# Patient Record
Sex: Female | Born: 1970
Health system: Southern US, Community
[De-identification: ages and names within clinical notes are randomized; demographics above are authoritative.]

## PROBLEM LIST (undated history)

## (undated) DIAGNOSIS — Z8619 Personal history of other infectious and parasitic diseases: Secondary | ICD-10-CM

## (undated) DIAGNOSIS — E669 Obesity, unspecified: Secondary | ICD-10-CM

## (undated) DIAGNOSIS — Z9884 Bariatric surgery status: Secondary | ICD-10-CM

## (undated) DIAGNOSIS — G4733 Obstructive sleep apnea (adult) (pediatric): Secondary | ICD-10-CM

## (undated) DIAGNOSIS — D649 Anemia, unspecified: Secondary | ICD-10-CM

## (undated) DIAGNOSIS — L309 Dermatitis, unspecified: Secondary | ICD-10-CM

## (undated) DIAGNOSIS — F418 Other specified anxiety disorders: Secondary | ICD-10-CM

## (undated) DIAGNOSIS — E039 Hypothyroidism, unspecified: Secondary | ICD-10-CM

## (undated) DIAGNOSIS — Z9989 Dependence on other enabling machines and devices: Secondary | ICD-10-CM

## (undated) DIAGNOSIS — I10 Essential (primary) hypertension: Secondary | ICD-10-CM

## (undated) HISTORY — DX: Other specified anxiety disorders: F41.8

## (undated) HISTORY — DX: Anemia, unspecified: D64.9

## (undated) HISTORY — DX: Obstructive sleep apnea (adult) (pediatric): G47.33

## (undated) HISTORY — DX: Essential (primary) hypertension: I10

## (undated) HISTORY — DX: Hypothyroidism, unspecified: E03.9

## (undated) HISTORY — DX: Obesity, unspecified: E66.9

## (undated) HISTORY — DX: Personal history of other infectious and parasitic diseases: Z86.19

## (undated) HISTORY — DX: Dermatitis, unspecified: L30.9

## (undated) HISTORY — DX: Dependence on other enabling machines and devices: Z99.89

## (undated) HISTORY — DX: Bariatric surgery status: Z98.84

---

## 1997-08-07 HISTORY — PX: OTHER SURGICAL HISTORY: SHX169

## 2001-08-07 HISTORY — PX: TUBAL LIGATION: SHX77

## 2004-05-17 ENCOUNTER — Ambulatory Visit: Payer: Self-pay | Admitting: Obstetrics & Gynecology

## 2007-10-15 ENCOUNTER — Ambulatory Visit: Payer: Self-pay | Admitting: Obstetrics & Gynecology

## 2010-06-13 ENCOUNTER — Ambulatory Visit (HOSPITAL_COMMUNITY): Admission: RE | Admit: 2010-06-13 | Discharge: 2010-06-13 | Payer: Self-pay | Admitting: Internal Medicine

## 2011-03-21 ENCOUNTER — Encounter: Payer: Self-pay | Admitting: Family Medicine

## 2011-03-21 ENCOUNTER — Ambulatory Visit (INDEPENDENT_AMBULATORY_CARE_PROVIDER_SITE_OTHER): Payer: BC Managed Care – PPO | Admitting: Family Medicine

## 2011-03-21 DIAGNOSIS — F341 Dysthymic disorder: Secondary | ICD-10-CM

## 2011-03-21 DIAGNOSIS — E039 Hypothyroidism, unspecified: Secondary | ICD-10-CM

## 2011-03-21 DIAGNOSIS — Z639 Problem related to primary support group, unspecified: Secondary | ICD-10-CM | POA: Insufficient documentation

## 2011-03-21 DIAGNOSIS — F418 Other specified anxiety disorders: Secondary | ICD-10-CM

## 2011-03-21 DIAGNOSIS — R030 Elevated blood-pressure reading, without diagnosis of hypertension: Secondary | ICD-10-CM

## 2011-03-21 DIAGNOSIS — IMO0001 Reserved for inherently not codable concepts without codable children: Secondary | ICD-10-CM

## 2011-03-21 DIAGNOSIS — I10 Essential (primary) hypertension: Secondary | ICD-10-CM | POA: Insufficient documentation

## 2011-03-21 MED ORDER — VENLAFAXINE HCL 75 MG PO TABS: 75.0000 mg | ORAL_TABLET | Freq: Two times a day (BID) | ORAL | Status: DC

## 2011-03-21 NOTE — Assessment & Plan Note (Signed)
requested records from endo on latest blood work, pt states had chol and glu checked then as well.

## 2011-03-21 NOTE — Patient Instructions (Addendum)
Tdap today. Med refilled today. Keep eye on blood pressure, let us know if consistently >140/90. Return in 6 months for follow up, sooner if needed. Good to see you today, call us with questions.

## 2011-03-21 NOTE — Assessment & Plan Note (Signed)
Stable today. Advised to keep track of this at work, notify us if running hihg.
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
## 2011-03-21 NOTE — Assessment & Plan Note (Addendum)
Stable on effexor. Refilled today. Seems to be coping well currently with family stressors. To notify me if any change. Denies personal hx bipolar, denies SI/HI.

## 2011-08-08 DIAGNOSIS — Z9884 Bariatric surgery status: Secondary | ICD-10-CM

## 2011-08-08 HISTORY — DX: Bariatric surgery status: Z98.84

## 2011-09-21 ENCOUNTER — Ambulatory Visit: Payer: BC Managed Care – PPO | Admitting: Family Medicine

## 2011-09-22 ENCOUNTER — Encounter: Payer: Self-pay | Admitting: Family Medicine

## 2011-09-22 ENCOUNTER — Ambulatory Visit (INDEPENDENT_AMBULATORY_CARE_PROVIDER_SITE_OTHER): Payer: BC Managed Care – PPO | Admitting: Family Medicine

## 2011-09-22 DIAGNOSIS — F341 Dysthymic disorder: Secondary | ICD-10-CM

## 2011-09-22 DIAGNOSIS — R030 Elevated blood-pressure reading, without diagnosis of hypertension: Secondary | ICD-10-CM

## 2011-09-22 DIAGNOSIS — IMO0001 Reserved for inherently not codable concepts without codable children: Secondary | ICD-10-CM

## 2011-09-22 DIAGNOSIS — E039 Hypothyroidism, unspecified: Secondary | ICD-10-CM

## 2011-09-22 DIAGNOSIS — F418 Other specified anxiety disorders: Secondary | ICD-10-CM

## 2011-09-22 LAB — TSH: TSH: 0.28 u[IU]/mL — ABNORMAL LOW (ref 0.35–5.50)

## 2011-09-22 LAB — BASIC METABOLIC PANEL
CO2: 30 mEq/L (ref 19–32)
Glucose, Bld: 130 mg/dL — ABNORMAL HIGH (ref 70–99)
Potassium: 3.6 mEq/L (ref 3.5–5.1)
Sodium: 139 mEq/L (ref 135–145)

## 2011-09-22 MED ORDER — VENLAFAXINE HCL 75 MG PO TABS: 75.0000 mg | ORAL_TABLET | Freq: Two times a day (BID) | ORAL | Status: DC

## 2011-09-22 MED ORDER — VALSARTAN-HYDROCHLOROTHIAZIDE 160-12.5 MG PO TABS: 1.0000 | ORAL_TABLET | Freq: Every day | ORAL | Status: DC

## 2011-09-22 MED ORDER — LEVOTHYROXINE SODIUM 112 MCG PO TABS: 112.0000 ug | ORAL_TABLET | Freq: Every day | ORAL | Status: DC

## 2011-09-22 NOTE — Progress Notes (Signed)
  Subjective:    Patient ID: Diana Cox, female    DOB: 06-Sep-1970, 41 y.o.   MRN: [id number]  HPI CC: 6 mo f/u  Recently grandmother passed , more stress over this.  Endorses cause of tachycardia today.  HTN - started on diovan HCT by endo, would like to follow here.  No CP/tightness, SOB, leg swelling. Did have headaches more frequency, as well as more blurry vision.  Due to recheck with eye doctor.    Hypothyroid - stable on synthroid .  Last blood work was >6 mo ago.  Due for rpt today.  + constipation and some heat intolerance.  No skin or hair changes, cold intolerance.  Anxiety/depression - doing ok on effexor bid.  Some understandable family stress due to recent funeral.  Wt Readings from Last 3 Encounters:  09/22/11 257 lb (116.574 kg)  03/21/11 241 lb 4 oz (109.43 kg)   Tdap after cut left 4th finger at work s/p surgery 04/2011  Review of Systems Per HPI    Objective:   Physical Exam  Constitutional: She appears well-developed and well-nourished. No distress.  HENT:  Head: Normocephalic and atraumatic.  Mouth/Throat: Oropharynx is clear and moist. No oropharyngeal exudate.  Eyes: Conjunctivae and EOM are normal. Pupils are equal, round, and reactive to light. No scleral icterus.  Neck: Normal range of motion. Neck supple. Carotid bruit is not present. No thyromegaly present.  Cardiovascular: Normal rate, regular rhythm, normal heart sounds and intact distal pulses.   No murmur heard. Pulmonary/Chest: Effort normal and breath sounds normal. No respiratory distress. She has no wheezes. She has no rales.  Lymphadenopathy:    She has no cervical adenopathy.  Skin: Skin is warm and dry. No rash noted.          Assessment & Plan:

## 2011-09-22 NOTE — Patient Instructions (Signed)
Sign release form for records from Endo Dr.Clark Refilled meds. Return in 6 months or as needed. Good to see you today, call us with questions. I'm sorry for your loss.

## 2011-09-22 NOTE — Assessment & Plan Note (Signed)
Stable currently on effexor 75mg  bid. Continue.

## 2011-09-22 NOTE — Assessment & Plan Note (Addendum)
Pt has signed release again today for records from Dr. Chestine Spore. Refilled meds.  Pt would like to have [place] follow hypothyroid.

## 2011-09-22 NOTE — Assessment & Plan Note (Signed)
Tolerant and compliant with med. Continue. Started by endo recently.

## 2011-09-23 ENCOUNTER — Other Ambulatory Visit: Payer: Self-pay | Admitting: Family Medicine

## 2011-09-27 ENCOUNTER — Telehealth: Payer: Self-pay

## 2011-09-27 NOTE — Telephone Encounter (Signed)
Patient returned call regarding her lab results.  Patient was notified of results and medication changes.  She was not fasting for those labs.  Patient was satisfied.

## 2012-01-16 ENCOUNTER — Encounter: Payer: Self-pay | Admitting: Family Medicine

## 2012-01-16 ENCOUNTER — Ambulatory Visit (INDEPENDENT_AMBULATORY_CARE_PROVIDER_SITE_OTHER): Payer: BC Managed Care – PPO | Admitting: Family Medicine

## 2012-01-16 VITALS — BP 110/80 | HR 88 | Temp 98.8°F | Wt 256.5 lb

## 2012-01-16 DIAGNOSIS — E039 Hypothyroidism, unspecified: Secondary | ICD-10-CM

## 2012-01-16 DIAGNOSIS — R21 Rash and other nonspecific skin eruption: Secondary | ICD-10-CM | POA: Insufficient documentation

## 2012-01-16 LAB — T4, FREE: Free T4: 0.8 ng/dL (ref 0.60–1.60)

## 2012-01-16 MED ORDER — PREDNISONE 20 MG PO TABS: ORAL_TABLET | ORAL | Status: DC

## 2012-01-16 NOTE — Patient Instructions (Signed)
I think this is a contact dermatitis - treat with course of steroids. Use benadryl by mouth 25mg  (at night as may make you sleepy). May also take claritin or zyrtec for anti itch properties.  Continue calomine and may use oatmealbath for anti itch as well. Let us know how you're doing. TSH today to recheck thyroid function. Good to see you today, call us with questions.

## 2012-01-16 NOTE — Assessment & Plan Note (Signed)
Check TSH/free T4 today. Still no records from Dr. Chestine Spore, prior endo.

## 2012-01-16 NOTE — Assessment & Plan Note (Signed)
Unclear etiology, with lichenification 2 wks into course.  Anticipate contact dermatitis, will treat with course of oral steroids.  See pt instructions for further symptomatic relief. If not better, consider biopsy.

## 2012-01-16 NOTE — Progress Notes (Signed)
  Subjective:    Patient ID: Diana Cox, female    DOB: 1971-01-02, 41 y.o.   MRN: [id number]  HPI CC: rash  2 wk h/o rash bilateral arms.  Seems to be spreading.  Very itchy.  Has not been working outside at all.  End of May went to [facility].  Tends to get dry spots in [facility] fossa in spring.  That has not bothered her recently.  No new lotions, detergents, shampoos.  Changed from ivory to dove soap.  No new medicines.  No one else at home with similar rash.  So far has tried calomine lotion, topical benadryl, alcohol/chlorox.  No fevers/chills, nausea/vomiting, abd pain, oral lesions, joint pains, other rash.  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  Some cough first thing in am.  Started eating more fruits/vegetables.  Never received records from Dr. Chestine Spore.  09/22/2011 decreased synthroid dose 2/2 TSH 0.28.  Never returned for recheck.  Will check today.  Review of Systems Per HPI    Objective:   Physical Exam NAD, WDWN, AAF, obese Bilateral arms L>R with whitish pruritic papules diffusely laterally, some spread upper arms.  + excoriations with breaking of skin.    Assessment & Plan:

## 2012-01-18 ENCOUNTER — Other Ambulatory Visit: Payer: Self-pay | Admitting: Family Medicine

## 2012-01-18 DIAGNOSIS — E039 Hypothyroidism, unspecified: Secondary | ICD-10-CM

## 2012-01-18 MED ORDER — LEVOTHYROXINE SODIUM 100 MCG PO TABS: 100.0000 ug | ORAL_TABLET | Freq: Every day | ORAL | Status: DC

## 2012-03-11 ENCOUNTER — Other Ambulatory Visit (INDEPENDENT_AMBULATORY_CARE_PROVIDER_SITE_OTHER): Payer: BC Managed Care – PPO

## 2012-03-11 DIAGNOSIS — E039 Hypothyroidism, unspecified: Secondary | ICD-10-CM

## 2012-03-11 LAB — TSH: TSH: 0.58 u[IU]/mL (ref 0.35–5.50)

## 2012-03-12 ENCOUNTER — Encounter: Payer: Self-pay | Admitting: Family Medicine

## 2012-03-12 ENCOUNTER — Ambulatory Visit (INDEPENDENT_AMBULATORY_CARE_PROVIDER_SITE_OTHER): Payer: BC Managed Care – PPO | Admitting: Family Medicine

## 2012-03-12 VITALS — BP 142/90 | HR 80 | Temp 97.9°F | Wt 257.0 lb

## 2012-03-12 DIAGNOSIS — E039 Hypothyroidism, unspecified: Secondary | ICD-10-CM

## 2012-03-12 DIAGNOSIS — I10 Essential (primary) hypertension: Secondary | ICD-10-CM

## 2012-03-12 DIAGNOSIS — R21 Rash and other nonspecific skin eruption: Secondary | ICD-10-CM

## 2012-03-12 NOTE — Patient Instructions (Signed)
Return to see Dr. Purcell Nails for possible biopsy as steroid cream has not helped rash.  If any trouble with getting in to see derm, let us know. Thyroid function normal. Keep an eye on blood pressure (via cuff at work) for next several weeks, if staying consistently >140/90, let me know for increase in blood pressure medicine. Good to see you today, call us with questions.

## 2012-03-12 NOTE — Progress Notes (Signed)
  Subjective:    Patient ID: Diana Cox, female    DOB: 02/23/1971, 41 y.o.   MRN: [id number]  HPI CC: f/u hypothyroidism  Hypothyroidism - stays hot.  No skin or hair changes, diarrhea, constipation, cold intolerance.  Compliant with levothyroxine.  Skin rash - seen here 01/2012 with new rash, see prior note for details.  Actually scheduled appt and saw derm in [place] after seen here. Dr. Purcell Nails.  Placed on halcinonide cream, using twice daily.  States no dx given, told possible biopsy if not better.  HTN - does not check at home.  No HA, vision changes, CP/tightness, SOB, leg swelling.  Saw eye doctor recently, prescription changed.  Past Medical History  Diagnosis Date  . Anemia     during pregnancy  . History of chicken pox     as child  . Depression with anxiety   . Elevated BP   . Hypothyroid     goiter     Review of Systems Per HPI    Objective:   Physical Exam  Nursing note and vitals reviewed. Constitutional: She appears well-developed and well-nourished. No distress.  HENT:  Head: Normocephalic and atraumatic.  Mouth/Throat: Oropharynx is clear and moist. No oropharyngeal exudate.  Eyes: Conjunctivae and EOM are normal. Pupils are equal, round, and reactive to light. No scleral icterus.  Neck: Normal range of motion. Neck supple. Carotid bruit is not present. No thyromegaly present.  Cardiovascular: Normal rate, regular rhythm, normal heart sounds and intact distal pulses.   No murmur heard. Pulmonary/Chest: Effort normal and breath sounds normal. No respiratory distress. She has no wheezes. She has no rales.  Musculoskeletal: She exhibits no edema.  Lymphadenopathy:    She has no cervical adenopathy.  Skin: Skin is warm and dry. Rash noted.       Bilateral arms L>R with whitish pruritic papules diffusely, start as clusters of small papules and enlarge, some spread L upper arm.  + excoriation  Psychiatric: She has a normal mood and affect.         Assessment & Plan:

## 2012-03-12 NOTE — Assessment & Plan Note (Signed)
Reviewed recent TSH with pt. Chronic. Stable.  Continue med.

## 2012-03-12 NOTE — Assessment & Plan Note (Signed)
Prior eval though consistent with contact dermatitis, however staying present for last 3 months, not improving with topical steroids prescribed by derm.  Recommended return to dermatologist for biopsy as prior discussed with her.

## 2012-03-12 NOTE — Assessment & Plan Note (Signed)
Chronic. Stable on current regimen, continue meds. Discussed possibly increasing dose if bp staying elevated outside of office, asked her to call us in a few weeks with BP update.

## 2012-03-14 ENCOUNTER — Ambulatory Visit: Payer: BC Managed Care – PPO | Admitting: Family Medicine

## 2012-03-18 ENCOUNTER — Telehealth: Payer: Self-pay

## 2012-03-18 DIAGNOSIS — R21 Rash and other nonspecific skin eruption: Secondary | ICD-10-CM

## 2012-03-18 NOTE — Telephone Encounter (Signed)
Placed referral in chart and routed to [facility]. 

## 2012-03-18 NOTE — Telephone Encounter (Signed)
Pt left v/m [facility] could not see pt until end of Nov or first of Dec. Pt request another referral to a different dermatologist; would like to be seen prior to end of Nov.Please advise.

## 2012-03-20 ENCOUNTER — Other Ambulatory Visit: Payer: BC Managed Care – PPO

## 2012-03-20 NOTE — Telephone Encounter (Signed)
Appt made with Dr Terri Piedra patient aware.

## 2012-03-20 NOTE — Telephone Encounter (Signed)
Pt called to inquire about dermatology referral. Transferred and Shirlee Limerick spoke with pt.

## 2012-03-21 ENCOUNTER — Ambulatory Visit: Payer: BC Managed Care – PPO | Admitting: Family Medicine

## 2012-04-28 ENCOUNTER — Encounter: Payer: Self-pay | Admitting: Family Medicine

## 2012-05-23 ENCOUNTER — Other Ambulatory Visit: Payer: Self-pay | Admitting: *Deleted

## 2012-05-23 MED ORDER — VENLAFAXINE HCL 75 MG PO TABS: 75.0000 mg | ORAL_TABLET | Freq: Two times a day (BID) | ORAL | Status: DC

## 2012-06-17 ENCOUNTER — Ambulatory Visit: Payer: Self-pay | Admitting: Bariatrics

## 2012-06-17 LAB — IRON AND TIBC
Iron Bind.Cap.(Total): 423 ug/dL (ref 250–450)
Iron: 109 ug/dL (ref 50–170)
Unbound Iron-Bind.Cap.: 314 ug/dL

## 2012-06-17 LAB — CBC WITH DIFFERENTIAL/PLATELET
Basophil #: 0 10*3/uL (ref 0.0–0.1)
Basophil %: 0.6 %
Eosinophil %: 3.5 %
HCT: 39.3 % (ref 35.0–47.0)
HGB: 13 g/dL (ref 12.0–16.0)
Lymphocyte #: 1.5 10*3/uL (ref 1.0–3.6)
Lymphocyte %: 26.2 %
MCHC: 33 g/dL (ref 32.0–36.0)
MCV: 92 fL (ref 80–100)
Monocyte #: 0.4 x10 3/mm (ref 0.2–0.9)
Monocyte %: 7 %
Neutrophil #: 3.6 10*3/uL (ref 1.4–6.5)
RBC: 4.29 10*6/uL (ref 3.80–5.20)
RDW: 15 % — ABNORMAL HIGH (ref 11.5–14.5)
WBC: 5.7 10*3/uL (ref 3.6–11.0)

## 2012-06-17 LAB — COMPREHENSIVE METABOLIC PANEL
BUN: 23 mg/dL — ABNORMAL HIGH (ref 7–18)
Bilirubin,Total: 0.4 mg/dL (ref 0.2–1.0)
Calcium, Total: 8.5 mg/dL (ref 8.5–10.1)
Chloride: 106 mmol/L (ref 98–107)
Co2: 27 mmol/L (ref 21–32)
EGFR (Non-African Amer.): >60
Potassium: 3.6 mmol/L (ref 3.5–5.1)
SGOT(AST): 22 U/L (ref 15–37)
SGPT (ALT): 24 U/L (ref 12–78)

## 2012-06-17 LAB — MAGNESIUM: Magnesium: 2 mg/dL

## 2012-06-17 LAB — HEMOGLOBIN A1C: Hemoglobin A1C: 6.7 % — ABNORMAL HIGH (ref 4.2–6.3)

## 2012-06-17 LAB — PHOSPHORUS: Phosphorus: 1.9 mg/dL — ABNORMAL LOW (ref 2.5–4.9)

## 2012-06-17 LAB — FOLATE: Folic Acid: 6.4 ng/mL (ref 3.1–100.0)

## 2012-06-17 LAB — LIPASE, BLOOD: Lipase: 127 U/L (ref 73–393)

## 2012-06-17 LAB — BILIRUBIN, DIRECT: Bilirubin, Direct: <0.1 mg/dL (ref 0.00–0.20)

## 2012-06-17 LAB — FERRITIN: Ferritin (ARMC): 11 ng/mL (ref 8–388)

## 2012-06-17 LAB — PROTIME-INR: INR: 0.9

## 2012-06-25 ENCOUNTER — Ambulatory Visit: Payer: Self-pay | Admitting: Bariatrics

## 2012-06-26 ENCOUNTER — Other Ambulatory Visit: Payer: Self-pay | Admitting: *Deleted

## 2012-06-26 MED ORDER — VALSARTAN-HYDROCHLOROTHIAZIDE 160-12.5 MG PO TABS: 1.0000 | ORAL_TABLET | Freq: Every day | ORAL | Status: DC

## 2012-07-07 ENCOUNTER — Ambulatory Visit: Payer: Self-pay | Admitting: Bariatrics

## 2012-07-07 HISTORY — PX: LAPAROSCOPIC GASTRIC SLEEVE RESECTION: SHX5895

## 2012-07-25 ENCOUNTER — Ambulatory Visit: Payer: Self-pay | Admitting: Bariatrics

## 2012-07-25 LAB — PREGNANCY, URINE: Pregnancy Test, Urine: NEGATIVE m[IU]/mL

## 2012-07-26 ENCOUNTER — Inpatient Hospital Stay: Payer: Self-pay | Admitting: Bariatrics

## 2012-07-27 LAB — CBC WITH DIFFERENTIAL/PLATELET
Basophil #: 0 10*3/uL (ref 0.0–0.1)
Eosinophil #: 0 10*3/uL (ref 0.0–0.7)
HCT: 36.4 % (ref 35.0–47.0)
HGB: 12.1 g/dL (ref 12.0–16.0)
Lymphocyte %: 18.9 %
MCH: 30.2 pg (ref 26.0–34.0)
MCHC: 33.1 g/dL (ref 32.0–36.0)
MCV: 91 fL (ref 80–100)
Monocyte #: 0.5 x10 3/mm (ref 0.2–0.9)
Monocyte %: 8.1 %
Neutrophil #: 4.7 10*3/uL (ref 1.4–6.5)
Neutrophil %: 72.3 %
Platelet: 216 10*3/uL (ref 150–440)
RDW: 14.4 % (ref 11.5–14.5)

## 2012-07-29 ENCOUNTER — Encounter: Payer: Self-pay | Admitting: Family Medicine

## 2012-08-20 ENCOUNTER — Ambulatory Visit: Payer: Self-pay | Admitting: Bariatrics

## 2012-08-27 ENCOUNTER — Encounter: Payer: Self-pay | Admitting: Family Medicine

## 2012-08-27 ENCOUNTER — Ambulatory Visit (INDEPENDENT_AMBULATORY_CARE_PROVIDER_SITE_OTHER): Payer: BC Managed Care – PPO | Admitting: Family Medicine

## 2012-08-27 VITALS — BP 118/80 | HR 74 | Temp 98.5°F | Ht 66.0 in | Wt 241.0 lb

## 2012-08-27 DIAGNOSIS — E66811 Obesity, class 1: Secondary | ICD-10-CM | POA: Insufficient documentation

## 2012-08-27 DIAGNOSIS — R7309 Other abnormal glucose: Secondary | ICD-10-CM

## 2012-08-27 DIAGNOSIS — R739 Hyperglycemia, unspecified: Secondary | ICD-10-CM | POA: Insufficient documentation

## 2012-08-27 DIAGNOSIS — E669 Obesity, unspecified: Secondary | ICD-10-CM | POA: Insufficient documentation

## 2012-08-27 DIAGNOSIS — E039 Hypothyroidism, unspecified: Secondary | ICD-10-CM

## 2012-08-27 DIAGNOSIS — I10 Essential (primary) hypertension: Secondary | ICD-10-CM

## 2012-08-27 MED ORDER — VALSARTAN 160 MG PO TABS: 160.0000 mg | ORAL_TABLET | Freq: Every day | ORAL | Status: DC

## 2012-08-27 NOTE — Assessment & Plan Note (Signed)
Recheck sugar when returns fasting.

## 2012-08-27 NOTE — Assessment & Plan Note (Addendum)
Great control on current regimen. Will titrate off combo med and only prescribe diovan 160mg  daily. Keep track of bp at home and update me if staying elevated. rtc 6 mo for f/u. Pt agrees with plan.

## 2012-08-27 NOTE — Progress Notes (Signed)
  Subjective:    Patient ID: Diana Cox, female    DOB: February 19, 1971, 42 y.o.   MRN: [id number]  HPI CC: check blood pressure after bariatric surgery.  07/26/2012 had sleeve gastrectomy at [facility] by Dr. Alva Garnet.  BP at work running 110s/70s.  No HA, vision changes, CP/tightness, SOB, leg swelling.  Has not been on prior bp meds other than diovan HCT.  No med ever caused cough.  Working on diet, fish for protein, juicing for vegetables. Working on exercise - walking 30 min daily.  Noted 30lb weight loss in 1 month since surgery.  Birth control - BTL.  Wt Readings from Last 3 Encounters:  08/27/12 241 lb (109.317 kg)  03/12/12 257 lb (116.574 kg)  01/16/12 256 lb 8 oz (116.348 kg)   Past Medical History  Diagnosis Date  . Anemia     during pregnancy  . History of chicken pox     as child  . Depression with anxiety   . HTN (hypertension)   . Hypothyroid     goiter  . Dermatitis     ?lichen planus s/p biopsy by derm; cx neg  . Bariatric surgery status 2013   Past Surgical History  Procedure Date  . Fingertip amputation 1999    R index  . Laparoscopic gastric sleeve resection 07/2012    Dr. Alva Garnet   Review of Systems Per HPI    Objective:   Physical Exam  Nursing note and vitals reviewed. Constitutional: She appears well-developed and well-nourished. No distress.  HENT:  Mouth/Throat: Oropharynx is clear and moist. No oropharyngeal exudate.  Eyes: Conjunctivae normal and EOM are normal. Pupils are equal, round, and reactive to light.  Neck: Normal range of motion. Neck supple. No thyromegaly present.  Cardiovascular: Normal rate, regular rhythm, normal heart sounds and intact distal pulses.   No murmur heard. Pulmonary/Chest: Effort normal and breath sounds normal. No respiratory distress. She has no wheezes. She has no rales.  Musculoskeletal: She exhibits no edema.  Lymphadenopathy:    She has no cervical adenopathy.  Skin: Skin is warm and dry. No rash noted.    Psychiatric: She has a normal mood and affect.       Assessment & Plan:

## 2012-08-27 NOTE — Assessment & Plan Note (Signed)
Chronic. May need decreased dose of levothyroxine with weight loss - check TSH today.

## 2012-08-27 NOTE — Assessment & Plan Note (Signed)
Encouraged continued weight loss. Following diet and activity plan s/p lap sleeve gastrectomy

## 2012-08-27 NOTE — Patient Instructions (Signed)
Let's back off blood pressure medicine - changed from combo pill to only diovan 160mg  daily. Keep an eye on bp at home and if consistently staying >140/90, let me know. Return in 6 months for follow up blood pressure. Blood work today - if thyroid off, we will change dose and have you come back in 1-2 months to recheck thyroid dose (and fasting chol and sugar at that time).

## 2012-08-28 LAB — TSH: TSH: 0.19 u[IU]/mL — ABNORMAL LOW (ref 0.35–5.50)

## 2012-08-29 ENCOUNTER — Other Ambulatory Visit: Payer: Self-pay | Admitting: Family Medicine

## 2012-08-29 DIAGNOSIS — E039 Hypothyroidism, unspecified: Secondary | ICD-10-CM

## 2012-08-29 MED ORDER — LEVOTHYROXINE SODIUM 88 MCG PO TABS: 88.0000 ug | ORAL_TABLET | Freq: Every day | ORAL | Status: DC

## 2012-08-30 ENCOUNTER — Encounter: Payer: Self-pay | Admitting: Family Medicine

## 2012-09-02 ENCOUNTER — Other Ambulatory Visit: Payer: BC Managed Care – PPO

## 2012-09-07 ENCOUNTER — Ambulatory Visit: Payer: Self-pay | Admitting: Bariatrics

## 2012-10-03 ENCOUNTER — Other Ambulatory Visit: Payer: BC Managed Care – PPO

## 2012-10-24 ENCOUNTER — Other Ambulatory Visit: Payer: Self-pay | Admitting: *Deleted

## 2012-10-24 MED ORDER — VENLAFAXINE HCL 75 MG PO TABS: 75.0000 mg | ORAL_TABLET | Freq: Two times a day (BID) | ORAL | Status: DC

## 2012-11-28 ENCOUNTER — Other Ambulatory Visit: Payer: Self-pay | Admitting: *Deleted

## 2012-11-28 MED ORDER — VENLAFAXINE HCL 75 MG PO TABS: 75.0000 mg | ORAL_TABLET | Freq: Two times a day (BID) | ORAL | Status: DC

## 2013-02-28 ENCOUNTER — Telehealth: Payer: Self-pay

## 2013-02-28 NOTE — Telephone Encounter (Signed)
Will see then. 

## 2013-02-28 NOTE — Telephone Encounter (Signed)
Pt request blood work for bariatric doctor, Dr Effie Shy [facility] [place]. Pt has already had sleeve surgery 07/26/2012.Dr Alva Garnet gave pt a print out of blood work pt needed to have done but Dr Alva Garnet wants pt to be seen by Dr Reece Agar also. Pt scheduled appt 03/03/13 at 8:15 am.

## 2013-03-03 ENCOUNTER — Ambulatory Visit (INDEPENDENT_AMBULATORY_CARE_PROVIDER_SITE_OTHER): Payer: BC Managed Care – PPO | Admitting: Family Medicine

## 2013-03-03 ENCOUNTER — Encounter: Payer: Self-pay | Admitting: Family Medicine

## 2013-03-03 VITALS — BP 124/72 | HR 88 | Temp 98.3°F | Wt 207.5 lb

## 2013-03-03 DIAGNOSIS — Z9884 Bariatric surgery status: Secondary | ICD-10-CM

## 2013-03-03 DIAGNOSIS — E039 Hypothyroidism, unspecified: Secondary | ICD-10-CM

## 2013-03-03 DIAGNOSIS — I10 Essential (primary) hypertension: Secondary | ICD-10-CM

## 2013-03-03 LAB — COMPREHENSIVE METABOLIC PANEL
AST: 22 U/L (ref 0–37)
Albumin: 3.8 g/dL (ref 3.5–5.2)
Alkaline Phosphatase: 70 U/L (ref 39–117)
BUN: 14 mg/dL (ref 6–23)
Glucose, Bld: 89 mg/dL (ref 70–99)
Potassium: 4.3 mEq/L (ref 3.5–5.1)
Sodium: 138 mEq/L (ref 135–145)
Total Bilirubin: 0.4 mg/dL (ref 0.3–1.2)
Total Protein: 7.1 g/dL (ref 6.0–8.3)

## 2013-03-03 LAB — LIPID PANEL
Cholesterol: 178 mg/dL (ref 0–200)
LDL Cholesterol: 118 mg/dL — ABNORMAL HIGH (ref 0–99)
Total CHOL/HDL Ratio: 4
VLDL: 17.8 mg/dL (ref 0.0–40.0)

## 2013-03-03 LAB — CBC WITH DIFFERENTIAL/PLATELET
Basophils Relative: 0.6 % (ref 0.0–3.0)
Eosinophils Absolute: 0 10*3/uL (ref 0.0–0.7)
Eosinophils Relative: 1.4 % (ref 0.0–5.0)
HCT: 39.2 % (ref 36.0–46.0)
Lymphs Abs: 1.4 10*3/uL (ref 0.7–4.0)
MCHC: 32.8 g/dL (ref 30.0–36.0)
MCV: 89.7 fl (ref 78.0–100.0)
Monocytes Absolute: 0.2 10*3/uL (ref 0.1–1.0)
Neutrophils Relative %: 49.3 % (ref 43.0–77.0)
RBC: 4.37 Mil/uL (ref 3.87–5.11)
WBC: 3.3 10*3/uL — ABNORMAL LOW (ref 4.5–10.5)

## 2013-03-03 LAB — VITAMIN B12: Vitamin B-12: 1375 pg/mL — ABNORMAL HIGH (ref 211–911)

## 2013-03-03 LAB — PHOSPHORUS: Phosphorus: 2.5 mg/dL (ref 2.3–4.6)

## 2013-03-03 LAB — AMYLASE: Amylase: 65 U/L (ref 27–131)

## 2013-03-03 LAB — MAGNESIUM: Magnesium: 2.1 mg/dL (ref 1.5–2.5)

## 2013-03-03 LAB — FOLATE: Folate: 15 ng/mL (ref >5.9)

## 2013-03-03 NOTE — Assessment & Plan Note (Signed)
Chronic, stable. Well controlled on diovan 160mg . Pt will continue to monitor, with low threshold to decrease bp med.  No changes today. BP Readings from Last 3 Encounters:  03/03/13 124/72  08/27/12 118/80  03/12/12 142/90

## 2013-03-03 NOTE — Patient Instructions (Addendum)
Blood work today.  We will fax results to Dr. Alva Garnet. Good to see you today, call us with questions. Return in 6 months, sooner if needed. Keep monitoring blood pressure at work - and let me know if consistently too low ( top number less than 100)

## 2013-03-03 NOTE — Progress Notes (Signed)
  Subjective:    Patient ID: Diana Cox, female    DOB: 04-02-1971, 42 y.o.   MRN: [id number]  HPI CC: f/u bypass surgery  Would like labs drawn today and faxed attention Dr. Alva Garnet (fax [phone]).  07/26/2012 had sleeve gastrectomy at [facility] by Dr. Alva Garnet. Has lost 35 lbs in last 6 months, total of 60 lbs. 1 hour daily at the gym, no trouble or intolerance with diet.  Wt Readings from Last 3 Encounters:  03/03/13 207 lb 8 oz (94.121 kg)  08/27/12 241 lb (109.317 kg)  03/12/12 257 lb (116.574 kg)   HTN - bp running well controlled at home.  Last check at work was 111/72.  No HA, vision changes, CP/tightness, SOB, leg swelling.  Hypothyroid - due for recheck today.  Well woman - sees Dr. Scharlene Gloss, with normal mammogram and normal pelvic/pap  Past Medical History  Diagnosis Date  . Anemia     during pregnancy  . History of chicken pox     as child  . Depression with anxiety   . HTN (hypertension)     improved with bypass  . Hypothyroid     goiter  . Dermatitis     ?lichen planus s/p biopsy by derm; cx neg  . Bariatric surgery status 2013  . OSA on CPAP 2013  . Obesity     Past Surgical History  Procedure Laterality Date  . Fingertip amputation  1999    R index  . Laparoscopic gastric sleeve resection  07/2012    Dr. Alva Garnet  . Tubal ligation  2003    bilat   Review of Systems Per HPI    Objective:   Physical Exam  Nursing note and vitals reviewed. Constitutional: She appears well-developed and well-nourished. No distress.  HENT:  Head: Normocephalic and atraumatic.  Mouth/Throat: Oropharynx is clear and moist. No oropharyngeal exudate.  Cardiovascular: Normal rate, regular rhythm, normal heart sounds and intact distal pulses.   No murmur heard. Pulmonary/Chest: Effort normal and breath sounds normal. No respiratory distress. She has no wheezes. She has no rales.  Musculoskeletal: She exhibits no edema.  Psychiatric: She has a normal mood and affect.        Assessment & Plan:

## 2013-03-03 NOTE — Assessment & Plan Note (Signed)
Chronic. Anticipate will need decreased dose - check TSH today.

## 2013-03-03 NOTE — Assessment & Plan Note (Signed)
Congratulated on weight loss! Body mass index is 33.51 kg/(m^2). Blood work today.  Will fax results attn Dr. Alva Garnet.

## 2013-03-04 LAB — VITAMIN D 25 HYDROXY (VIT D DEFICIENCY, FRACTURES): Vit D, 25-Hydroxy: 34 ng/mL (ref 30–89)

## 2013-03-07 ENCOUNTER — Other Ambulatory Visit: Payer: Self-pay | Admitting: Family Medicine

## 2013-03-07 ENCOUNTER — Encounter: Payer: Self-pay | Admitting: *Deleted

## 2013-03-07 MED ORDER — FERROUS SULFATE 325 (65 FE) MG PO TABS: 325.0000 mg | ORAL_TABLET | Freq: Every day | ORAL | Status: DC

## 2013-03-24 ENCOUNTER — Telehealth: Payer: Self-pay | Admitting: *Deleted

## 2013-03-24 NOTE — Telephone Encounter (Signed)
Medication substitution form in your IN box for completion.

## 2013-03-25 NOTE — Telephone Encounter (Signed)
Signed and placed in my outbox.

## 2013-04-14 ENCOUNTER — Encounter: Payer: Self-pay | Admitting: Family Medicine

## 2013-04-14 ENCOUNTER — Ambulatory Visit (INDEPENDENT_AMBULATORY_CARE_PROVIDER_SITE_OTHER): Payer: BC Managed Care – PPO | Admitting: Family Medicine

## 2013-04-14 VITALS — BP 116/74 | HR 84 | Temp 98.8°F | Wt 213.8 lb

## 2013-04-14 DIAGNOSIS — S6980XA Other specified injuries of unspecified wrist, hand and finger(s), initial encounter: Secondary | ICD-10-CM

## 2013-04-14 DIAGNOSIS — S6990XA Unspecified injury of unspecified wrist, hand and finger(s), initial encounter: Secondary | ICD-10-CM

## 2013-04-14 DIAGNOSIS — S6991XA Unspecified injury of right wrist, hand and finger(s), initial encounter: Secondary | ICD-10-CM

## 2013-04-14 NOTE — Assessment & Plan Note (Signed)
No evidence of infection. Will treat with vaseline gauze and discussed dressing changes with vaseline at home to hopefully keep proximal nailbed viable while new nail grows in. Discussed red flags to seek care, low threshold to start abx.

## 2013-04-14 NOTE — Progress Notes (Signed)
  Subjective:    Patient ID: Diana Cox, female    DOB: [birth year], 42 y.o.   MRN: [id number]  HPI CC:R index finger issue.  Wednesday removed R index hangnail, entire proximal nail came off with it.  Distal nail intact.  Staying sore but no redness or pus drainage  Prior h/o traumatic amputation of R index finger at work, s/p reconstructive surgery where part of bone was removed and nail grew over.  Past Medical History  Diagnosis Date  . Anemia     during pregnancy  . History of chicken pox     as child  . Depression with anxiety   . HTN (hypertension)     improved with bypass  . Hypothyroid     goiter  . Dermatitis     ?lichen planus s/p biopsy by derm; cx neg  . Bariatric surgery status 2013  . OSA on CPAP 2013  . Obesity     Past Surgical History  Procedure Laterality Date  . Fingertip amputation  1999    R index  . Laparoscopic gastric sleeve resection  07/2012    Dr. Alva Garnet  . Tubal ligation  2003    bilat    Review of Systems Per HPI    Objective:   Physical Exam  Nursing note and vitals reviewed. Constitutional: She appears well-developed and well-nourished. No distress.  Musculoskeletal:  R index finger with proximal nail missing, distal nail intact Some deformity of distal finger tip and nail present after traumatic amputation.  Skin:  No erythema        Assessment & Plan:

## 2013-04-14 NOTE — Patient Instructions (Signed)
No signs of infection today - watch for spreading redness, worsening pain or any draining Let us know if any concerns for infection Continue biotin for nails. Try to keep nailbed wet. Good to see you today, call us with questions.

## 2013-06-12 ENCOUNTER — Other Ambulatory Visit: Payer: Self-pay

## 2013-06-23 ENCOUNTER — Other Ambulatory Visit: Payer: Self-pay | Admitting: Family Medicine

## 2013-09-22 ENCOUNTER — Other Ambulatory Visit: Payer: Self-pay | Admitting: Family Medicine

## 2013-11-21 ENCOUNTER — Other Ambulatory Visit: Payer: Self-pay | Admitting: Family Medicine

## 2013-12-05 ENCOUNTER — Ambulatory Visit: Payer: BC Managed Care – PPO | Admitting: Family Medicine

## 2013-12-10 ENCOUNTER — Telehealth: Payer: Self-pay | Admitting: Family Medicine

## 2013-12-10 ENCOUNTER — Other Ambulatory Visit: Payer: Self-pay | Admitting: Family Medicine

## 2013-12-10 ENCOUNTER — Encounter: Payer: Self-pay | Admitting: Family Medicine

## 2013-12-10 ENCOUNTER — Ambulatory Visit (INDEPENDENT_AMBULATORY_CARE_PROVIDER_SITE_OTHER): Payer: BC Managed Care – PPO | Admitting: Family Medicine

## 2013-12-10 VITALS — BP 118/76 | HR 84 | Temp 98.2°F | Wt 209.8 lb

## 2013-12-10 DIAGNOSIS — E039 Hypothyroidism, unspecified: Secondary | ICD-10-CM

## 2013-12-10 DIAGNOSIS — E611 Iron deficiency: Secondary | ICD-10-CM

## 2013-12-10 DIAGNOSIS — F418 Other specified anxiety disorders: Secondary | ICD-10-CM

## 2013-12-10 DIAGNOSIS — F341 Dysthymic disorder: Secondary | ICD-10-CM

## 2013-12-10 DIAGNOSIS — D509 Iron deficiency anemia, unspecified: Secondary | ICD-10-CM | POA: Insufficient documentation

## 2013-12-10 DIAGNOSIS — I10 Essential (primary) hypertension: Secondary | ICD-10-CM

## 2013-12-10 DIAGNOSIS — R21 Rash and other nonspecific skin eruption: Secondary | ICD-10-CM

## 2013-12-10 LAB — BASIC METABOLIC PANEL
BUN: 15 mg/dL (ref 6–23)
CALCIUM: 9.4 mg/dL (ref 8.4–10.5)
CO2: 28 mEq/L (ref 19–32)
CREATININE: 0.8 mg/dL (ref 0.4–1.2)
Chloride: 105 mEq/L (ref 96–112)
GFR: 97.48 mL/min (ref >60.00)
GLUCOSE: 80 mg/dL (ref 70–99)
Potassium: 3.9 mEq/L (ref 3.5–5.1)
Sodium: 139 mEq/L (ref 135–145)

## 2013-12-10 LAB — T4, FREE: FREE T4: 0.71 ng/dL (ref 0.60–1.60)

## 2013-12-10 LAB — TSH: TSH: 0.57 u[IU]/mL (ref 0.35–4.50)

## 2013-12-10 LAB — FERRITIN: Ferritin: 8.1 ng/mL — ABNORMAL LOW (ref 10.0–291.0)

## 2013-12-10 MED ORDER — FERROUS SULFATE 325 (65 FE) MG PO TABS: 325.0000 mg | ORAL_TABLET | Freq: Two times a day (BID) | ORAL | Status: DC

## 2013-12-10 MED ORDER — VENLAFAXINE HCL 75 MG PO TABS: ORAL_TABLET | ORAL | Status: DC

## 2013-12-10 MED ORDER — LEVOTHYROXINE SODIUM 88 MCG PO TABS: ORAL_TABLET | ORAL | Status: DC

## 2013-12-10 MED ORDER — VALSARTAN 80 MG PO TABS: 80.0000 mg | ORAL_TABLET | Freq: Every day | ORAL | Status: DC

## 2013-12-10 MED ORDER — HYDROXYZINE HCL 10 MG PO TABS: 10.0000 mg | ORAL_TABLET | ORAL | Status: DC | PRN

## 2013-12-10 NOTE — Telephone Encounter (Signed)
Relevant patient education assigned to patient using Emmi. ° °

## 2013-12-10 NOTE — Assessment & Plan Note (Signed)
Reviewed need to take immediate release effexor 75mg  bid.  Pt desires to continue IR formulation due to cost. Advised to let me know if any insurance issues with this script.

## 2013-12-10 NOTE — Assessment & Plan Note (Signed)
Recheck ferritin today. continue iron daily.

## 2013-12-10 NOTE — Assessment & Plan Note (Signed)
Refilled hydroxyzine.

## 2013-12-10 NOTE — Assessment & Plan Note (Signed)
Recheck today. 

## 2013-12-10 NOTE — Assessment & Plan Note (Signed)
Chronic, stable. Will decrease diovan to 80mg  daily.  Pt agrees.

## 2013-12-10 NOTE — Progress Notes (Signed)
BP 118/76  Pulse 84  Temp(Src) 98.2 F (36.8 C) (Oral)  Wt 209 lb 12 oz (95.142 kg)  LMP 12/08/2013   CC: med refill visit  Subjective:    Patient ID: Diana Cox, female    DOB: 12/03/1970, 43 y.o.   MRN: [id number]  HPI: Diana Cox is a 43 y.o. female presenting on 12/10/2013 for Follow-up and Medication Refill   H/o hypothyroidism - compliant with levothyroxine 88mcg once daily.  Due for recheck thyroid levels.  Denies thyroid sxs.  Lab Results  Component Value Date   TSH 0.44 03/03/2013   Mood - some depression in evenings.  Takes effexor 75mg  once daily although prescribed bid.  States insurance did not approve BID dosing and only provided her with QD dosing???  Advised this should not happen and to let me know if this happens again.  Will send in BID dosing #180 with RF3.  H/o bariatric surgery. Last visit found to have low iron stores - currently on iron 1 pill daily.  Some constipation improved with a bowl of ice cream.  Pruritic skin rash - thought lichen planus.  Triggers are pollen and heat.  Atarax helps - uses at night time. requests refill. Declines refill of steroid cream.  HTN - compliant with diovan 160mg  daily.  Always well controlled BP Readings from Last 3 Encounters:  12/10/13 118/76  04/14/13 116/74  03/03/13 124/72    Wt Readings from Last 3 Encounters:  12/10/13 209 lb 12 oz (95.142 kg)  04/14/13 213 lb 12 oz (96.956 kg)  03/03/13 207 lb 8 oz (94.121 kg)   Past Medical History  Diagnosis Date  . Anemia     during pregnancy  . History of chicken pox     as child  . Depression with anxiety   . HTN (hypertension)     improved with bypass  . Hypothyroid     goiter  . Dermatitis     ?lichen planus s/p biopsy by derm; cx neg  . Bariatric surgery status 2013  . OSA on CPAP 2013  . Obesity      Past Surgical History  Procedure Laterality Date  . Fingertip amputation  1999    R index  . Laparoscopic gastric sleeve resection  07/2012     Dr. Alva Garnetyner  . Tubal ligation  2003    bilat    Relevant past medical, surgical, family and social history reviewed and updated as indicated.  Allergies and medications reviewed and updated. Current Outpatient Prescriptions on File Prior to Visit  Medication Sig  . B Complex-C (B-COMPLEX WITH VITAMIN C) tablet Take 1 tablet by mouth daily.  . Biotin 1000 MCG tablet Take 2,000 mcg by mouth daily.  . cholecalciferol (VITAMIN D) 1000 UNITS tablet Take 1,000 Units by mouth daily.  . ferrous sulfate 325 (65 FE) MG tablet Take 1 tablet (325 mg total) by mouth daily with breakfast.  . Halcinonide (HALOG) 0.1 % CREA Apply topically 2 (two) times daily.  . Multiple Vitamin (MULTIVITAMIN) tablet Take 1 tablet by mouth daily.     No current facility-administered medications on file prior to visit.    Review of Systems Per HPI unless specifically indicated above    Objective:    BP 118/76  Pulse 84  Temp(Src) 98.2 F (36.8 C) (Oral)  Wt 209 lb 12 oz (95.142 kg)  LMP 12/08/2013  Physical Exam  Nursing note and vitals reviewed. Constitutional: She appears well-developed and well-nourished. No distress.  HENT:  Mouth/Throat: Oropharynx is clear and moist. No oropharyngeal exudate.  Cardiovascular: Normal rate, regular rhythm, normal heart sounds and intact distal pulses.   No murmur heard. Pulmonary/Chest: Effort normal and breath sounds normal. No respiratory distress. She has no wheezes. She has no rales.  Musculoskeletal: She exhibits no edema.  Skin: Rash noted.  Papular pruritic rash bilateral forearms. Scars bilateral arms from prior rash outbreak.       Assessment & Plan:  Pt states she is due for well woman exam (with [facility]).  Problem List Items Addressed This Visit   Depression with anxiety     Reviewed need to take immediate release effexor 75mg  bid.  Pt desires to continue IR formulation due to cost. Advised to let me know if any insurance issues with this  script.    HTN (hypertension)     Chronic, stable. Will decrease diovan to 80mg  daily.  Pt agrees.    Relevant Medications      valsartan (DIOVAN) tablet   Other Relevant Orders      Basic metabolic panel   Hypothyroid - Primary     Recheck today.    Relevant Medications      levothyroxine (SYNTHROID, LEVOTHROID) tablet   Other Relevant Orders      TSH      T4, Free   Skin rash     Refilled hydroxyzine.    Iron deficiency     Recheck ferritin today. continue iron daily.    Relevant Orders      Ferritin       Follow up plan: Return in about 1 year (around 12/11/2014), or as needed, for follow up.

## 2013-12-10 NOTE — Patient Instructions (Addendum)
I've refilled meds today. Blood work today and we will call you with results. Hold on refilling thyroid medicine until you get results. I've sent in twice daily dosing of effexor.  Let me know if any issues with this.

## 2013-12-10 NOTE — Progress Notes (Signed)
Pre visit review using our clinic review tool, if applicable. No additional management support is needed unless otherwise documented below in the visit note. 

## 2014-08-07 LAB — HM PAP SMEAR: HM PAP: NORMAL

## 2014-08-07 LAB — HM MAMMOGRAPHY: HM MAMMO: NORMAL (ref 0–4)

## 2014-09-22 ENCOUNTER — Ambulatory Visit: Payer: Self-pay | Admitting: Bariatrics

## 2014-10-06 ENCOUNTER — Encounter: Payer: Self-pay | Admitting: Family Medicine

## 2014-10-06 ENCOUNTER — Ambulatory Visit (INDEPENDENT_AMBULATORY_CARE_PROVIDER_SITE_OTHER): Payer: BLUE CROSS/BLUE SHIELD | Admitting: Family Medicine

## 2014-10-06 VITALS — BP 112/80 | HR 80 | Temp 99.1°F | Ht 66.0 in | Wt 206.8 lb

## 2014-10-06 DIAGNOSIS — Z23 Encounter for immunization: Secondary | ICD-10-CM

## 2014-10-06 DIAGNOSIS — Z0001 Encounter for general adult medical examination with abnormal findings: Secondary | ICD-10-CM | POA: Insufficient documentation

## 2014-10-06 DIAGNOSIS — E039 Hypothyroidism, unspecified: Secondary | ICD-10-CM

## 2014-10-06 DIAGNOSIS — E669 Obesity, unspecified: Secondary | ICD-10-CM

## 2014-10-06 DIAGNOSIS — E611 Iron deficiency: Secondary | ICD-10-CM | POA: Diagnosis not present

## 2014-10-06 DIAGNOSIS — Z9884 Bariatric surgery status: Secondary | ICD-10-CM

## 2014-10-06 DIAGNOSIS — I10 Essential (primary) hypertension: Secondary | ICD-10-CM | POA: Diagnosis not present

## 2014-10-06 DIAGNOSIS — F418 Other specified anxiety disorders: Secondary | ICD-10-CM

## 2014-10-06 DIAGNOSIS — Z0000 Encounter for general adult medical examination without abnormal findings: Secondary | ICD-10-CM

## 2014-10-06 DIAGNOSIS — R739 Hyperglycemia, unspecified: Secondary | ICD-10-CM

## 2014-10-06 LAB — VITAMIN D 25 HYDROXY (VIT D DEFICIENCY, FRACTURES): VITD: 24.16 ng/mL — ABNORMAL LOW (ref 30.00–100.00)

## 2014-10-06 LAB — COMPREHENSIVE METABOLIC PANEL
ALK PHOS: 72 U/L (ref 39–117)
ALT: 15 U/L (ref 0–35)
AST: 20 U/L (ref 0–37)
Albumin: 4.2 g/dL (ref 3.5–5.2)
BUN: 14 mg/dL (ref 6–23)
CO2: 32 meq/L (ref 19–32)
Calcium: 9.5 mg/dL (ref 8.4–10.5)
Chloride: 102 mEq/L (ref 96–112)
Creatinine, Ser: 0.7 mg/dL (ref 0.40–1.20)
GFR: 116.56 mL/min (ref >60.00)
Glucose, Bld: 97 mg/dL (ref 70–99)
Potassium: 4.3 mEq/L (ref 3.5–5.1)
SODIUM: 138 meq/L (ref 135–145)
Total Bilirubin: 0.4 mg/dL (ref 0.2–1.2)
Total Protein: 7.1 g/dL (ref 6.0–8.3)

## 2014-10-06 LAB — CBC WITH DIFFERENTIAL/PLATELET
BASOS PCT: 0.6 % (ref 0.0–3.0)
Basophils Absolute: 0 10*3/uL (ref 0.0–0.1)
Eosinophils Absolute: 0 10*3/uL (ref 0.0–0.7)
Eosinophils Relative: 1.3 % (ref 0.0–5.0)
HCT: 40.3 % (ref 36.0–46.0)
Hemoglobin: 13.3 g/dL (ref 12.0–15.0)
Lymphocytes Relative: 35.9 % (ref 12.0–46.0)
Lymphs Abs: 1.2 10*3/uL (ref 0.7–4.0)
MCHC: 33 g/dL (ref 30.0–36.0)
MCV: 89.2 fl (ref 78.0–100.0)
MONO ABS: 0.3 10*3/uL (ref 0.1–1.0)
Monocytes Relative: 7.4 % (ref 3.0–12.0)
NEUTROS ABS: 1.9 10*3/uL (ref 1.4–7.7)
NEUTROS PCT: 54.8 % (ref 43.0–77.0)
Platelets: 282 10*3/uL (ref 150.0–400.0)
RBC: 4.52 Mil/uL (ref 3.87–5.11)
RDW: 14.1 % (ref 11.5–15.5)
WBC: 3.5 10*3/uL — ABNORMAL LOW (ref 4.0–10.5)

## 2014-10-06 LAB — LIPID PANEL
CHOLESTEROL: 185 mg/dL (ref 0–200)
HDL: 54.7 mg/dL (ref >39.00)
LDL Cholesterol: 119 mg/dL — ABNORMAL HIGH (ref 0–99)
NONHDL: 130.3
Total CHOL/HDL Ratio: 3
Triglycerides: 58 mg/dL (ref 0.0–149.0)
VLDL: 11.6 mg/dL (ref 0.0–40.0)

## 2014-10-06 LAB — PHOSPHORUS: Phosphorus: 2.7 mg/dL (ref 2.3–4.6)

## 2014-10-06 LAB — IBC PANEL
IRON: 60 ug/dL (ref 42–145)
SATURATION RATIOS: 14.7 % — AB (ref 20.0–50.0)
TRANSFERRIN: 292 mg/dL (ref 212.0–360.0)

## 2014-10-06 LAB — MAGNESIUM: MAGNESIUM: 2.1 mg/dL (ref 1.5–2.5)

## 2014-10-06 LAB — VITAMIN B12: Vitamin B-12: >1500 pg/mL — ABNORMAL HIGH (ref 211–911)

## 2014-10-06 LAB — AMYLASE: Amylase: 52 U/L (ref 27–131)

## 2014-10-06 LAB — T4, FREE: FREE T4: 0.95 ng/dL (ref 0.60–1.60)

## 2014-10-06 LAB — FERRITIN: FERRITIN: 10.2 ng/mL (ref 10.0–291.0)

## 2014-10-06 LAB — TSH: TSH: 0.38 u[IU]/mL (ref 0.35–4.50)

## 2014-10-06 LAB — FOLATE

## 2014-10-06 MED ORDER — FERROUS SULFATE 325 (65 FE) MG PO TABS: 325.0000 mg | ORAL_TABLET | Freq: Every day | ORAL | Status: DC

## 2014-10-06 MED ORDER — VENLAFAXINE HCL 100 MG PO TABS: ORAL_TABLET | ORAL | Status: DC

## 2014-10-06 NOTE — Assessment & Plan Note (Signed)
Preventative protocols reviewed and updated unless pt declined. Discussed healthy diet and lifestyle.  

## 2014-10-06 NOTE — Assessment & Plan Note (Signed)
Continues iron tablet daily. Recheck today.

## 2014-10-06 NOTE — Assessment & Plan Note (Addendum)
Recheck sugar level today - fasting.

## 2014-10-06 NOTE — Patient Instructions (Addendum)
Flu shot today. Blood work today. Increase water and fiber. If not helpful, start colace (docusate) 100mg  daily. Increase effexor to 100mg  twice daily. Update me with effect in 1 month.

## 2014-10-06 NOTE — Progress Notes (Signed)
Pre visit review using our clinic review tool, if applicable. No additional management support is needed unless otherwise documented below in the visit note. 

## 2014-10-06 NOTE — Assessment & Plan Note (Signed)
Will increase effexor to 100mg  bid. Pt will call me with update in 1 month. Consider change to SR.

## 2014-10-06 NOTE — Assessment & Plan Note (Addendum)
check labs today and will fax attn Dr Alva Garnetyner Fax #[phone]

## 2014-10-06 NOTE — Assessment & Plan Note (Signed)
Chronic, stable. Check TSH today.  

## 2014-10-06 NOTE — Assessment & Plan Note (Signed)
Chronic, stable. Continue regimen. 

## 2014-10-06 NOTE — Addendum Note (Signed)
Addended by: Josph MachoANCE, KIMBERLY A on: 10/06/2014 11:10 AM   Modules accepted: Orders

## 2014-10-06 NOTE — Progress Notes (Signed)
BP 112/80 mmHg  Pulse 80  Temp(Src) 99.1 F (37.3 C) (Oral)  Ht 5\' 6"  (1.676 m)  Wt 206 lb 12 oz (93.781 kg)  BMI 33.39 kg/m2  LMP 09/22/2014   CC: CPE  Subjective:    Patient ID: Diana Cox, female    DOB: [birth year], 44 y.o.   MRN: [id number]  HPI: Diana Cox is a 44 y.o. female presenting on 10/06/2014 for Annual Exam   On effexor 75mg  bid. On this med since 2011. Over last 6 months noticing more trouble with mood lability. Not feeling overwhelmed. Adult coloring book helpful.  Sleeping well.   S/p lap gastric sleeve.  Fasting today.  Preventative: Breast cancer screening - normal mammogram 2013 Well woman exam - with [facility] Dr Jennette KettleNeal, needs to reschedule S/p BTL 2003 Flu shot - today Tetanus shot - 2012 Seat belt use discussed Sunscreen use and skin screen discussed  Caffeine: 2 cans mountain dew/day (1 hour drive, [place] Lives with BF, 1 cat Occ: works in Designer, fashion/clothingtextiles, Production managerbuilding tires Edu: AA 07/2011 Act: walks, bikes Diet: getting healthy, lots of water  Relevant past medical, surgical, family and social history reviewed and updated as indicated. Interim medical history since our last visit reviewed. Allergies and medications reviewed and updated. Current Outpatient Prescriptions on File Prior to Visit  Medication Sig  . B Complex-C (B-COMPLEX WITH VITAMIN C) tablet Take 1 tablet by mouth daily.  . Biotin 1000 MCG tablet Take 2,000 mcg by mouth daily.  . cholecalciferol (VITAMIN D) 1000 UNITS tablet Take 1,000 Units by mouth daily.  Marland Kitchen. levothyroxine (SYNTHROID, LEVOTHROID) 88 MCG tablet TAKE ONE (1) TABLET BY MOUTH EVERY DAY  . Multiple Vitamin (MULTIVITAMIN) tablet Take 1 tablet by mouth daily.    . valsartan (DIOVAN) 80 MG tablet Take 1 tablet (80 mg total) by mouth daily.   No current facility-administered medications on file prior to visit.    Review of Systems  Constitutional: Negative for fever, chills, activity change, appetite  change, fatigue and unexpected weight change.  HENT: Negative for hearing loss.   Eyes: Positive for visual disturbance (new contacts pending).  Respiratory: Negative for cough, chest tightness, shortness of breath and wheezing.   Cardiovascular: Negative for chest pain, palpitations and leg swelling.  Gastrointestinal: Positive for constipation (due to iron). Negative for nausea, vomiting, abdominal pain, diarrhea, blood in stool and abdominal distention.  Genitourinary: Negative for hematuria and difficulty urinating.  Musculoskeletal: Negative for myalgias, arthralgias and neck pain.  Skin: Negative for rash.  Neurological: Negative for dizziness, seizures, syncope and headaches.  Hematological: Negative for adenopathy. Does not bruise/bleed easily.  Psychiatric/Behavioral: Positive for dysphoric mood. The patient is nervous/anxious.    Per HPI unless specifically indicated above     Objective:    BP 112/80 mmHg  Pulse 80  Temp(Src) 99.1 F (37.3 C) (Oral)  Ht 5\' 6"  (1.676 m)  Wt 206 lb 12 oz (93.781 kg)  BMI 33.39 kg/m2  LMP 09/22/2014  Wt Readings from Last 3 Encounters:  10/06/14 206 lb 12 oz (93.781 kg)  12/10/13 209 lb 12 oz (95.142 kg)  04/14/13 213 lb 12 oz (96.956 kg)    Physical Exam  Constitutional: She is oriented to person, place, and time. She appears well-developed and well-nourished. No distress.  Obese and well appearing  HENT:  Head: Normocephalic and atraumatic.  Right Ear: Hearing, tympanic membrane, external ear and ear canal normal.  Left Ear: Hearing, tympanic membrane, external ear and ear  canal normal.  Nose: Nose normal.  Mouth/Throat: Uvula is midline, oropharynx is clear and moist and mucous membranes are normal. No oropharyngeal exudate, posterior oropharyngeal edema or posterior oropharyngeal erythema.  Eyes: Conjunctivae and EOM are normal. Pupils are equal, round, and reactive to light. No scleral icterus.  Neck: Normal range of motion. Neck  supple. No thyromegaly present.  Cardiovascular: Normal rate, regular rhythm, normal heart sounds and intact distal pulses.   No murmur heard. Pulses:      Radial pulses are 2+ on the right side, and 2+ on the left side.  Pulmonary/Chest: Effort normal and breath sounds normal. No respiratory distress. She has no wheezes. She has no rales.  Abdominal: Soft. Bowel sounds are normal. She exhibits no distension and no mass. There is no tenderness. There is no rebound and no guarding.  Musculoskeletal: Normal range of motion. She exhibits no edema.  Lymphadenopathy:    She has no cervical adenopathy.  Neurological: She is alert and oriented to person, place, and time.  CN grossly intact, station and gait intact  Skin: Skin is warm and dry. No rash noted.  Psychiatric: She has a normal mood and affect. Her behavior is normal. Judgment and thought content normal.  Nursing note and vitals reviewed.  Results for orders placed or performed in visit on 12/10/13  Ferritin  Result Value Ref Range   Ferritin 8.1 (L) 10.0 - 291.0 ng/mL  TSH  Result Value Ref Range   TSH 0.57 0.35 - 4.50 uIU/mL  T4, Free  Result Value Ref Range   Free T4 0.71 0.60 - 1.60 ng/dL  Basic metabolic panel  Result Value Ref Range   Sodium 139 135 - 145 mEq/L   Potassium 3.9 3.5 - 5.1 mEq/L   Chloride 105 96 - 112 mEq/L   CO2 28 19 - 32 mEq/L   Glucose, Bld 80 70 - 99 mg/dL   BUN 15 6 - 23 mg/dL   Creatinine, Ser 0.8 0.4 - 1.2 mg/dL   Calcium 9.4 8.4 - 16.1 mg/dL   GFR 09.60 >45.40 mL/min      Assessment & Plan:   Problem List Items Addressed This Visit    Obesity    Continued weight loss noted. Active with regular exercise routine and adheres to healthy diet.      Iron deficiency    Continues iron tablet daily. Recheck today.      Relevant Orders   IBC panel   Folate   Hypothyroid    Chronic, stable. Check TSH today.      Relevant Orders   T4, free   TSH   Hyperglycemia    Recheck sugar level  today - fasting.      HTN (hypertension)    Chronic, stable. Continue regimen.      Relevant Orders   Comprehensive metabolic panel   Lipid panel   Health care maintenance - Primary    Preventative protocols reviewed and updated unless pt declined. Discussed healthy diet and lifestyle.       Depression with anxiety    Will increase effexor to  bid. Pt will call me with update in 1 month. Consider change to SR.      Bariatric surgery status    check labs today and will fax attn Dr Alva Garnet Fax #[phone]      Relevant Orders   CBC with Differential/Platelet   Vitamin B12   Ferritin   Vit D  25 hydroxy (rtn osteoporosis monitoring)   Magnesium  Amylase   Phosphorus   Vitamin B1   Prealbumin       Follow up plan: Return in about 1 year (around 10/06/2015), or as needed, for annual exam, prior fasting for blood work.

## 2014-10-06 NOTE — Assessment & Plan Note (Signed)
Continued weight loss noted. Active with regular exercise routine and adheres to healthy diet.

## 2014-10-07 LAB — PREALBUMIN: PREALBUMIN: 19.2 mg/dL (ref 17.0–34.0)

## 2014-10-09 LAB — VITAMIN B1: Vitamin B1 (Thiamine): 8 nmol/L (ref 8–30)

## 2014-11-27 NOTE — Op Note (Signed)
PATIENT NAME:  Diana Cox, Diana Cox MR#:  [id number] DATE OF BIRTH:  10/04/1970  DATE OF PROCEDURE:  07/26/2012   PROCEDURE PERFORMED: Laparoscopic sleeve gastrectomy.   SURGEON: Tyrone AppleMichael A. Tyner, MD.   PREOPERATIVE DIAGNOSIS:  Morbid obesity with BMI of 43, associated with hypertension.   POSTOPERATIVE DIAGNOSIS:  Morbid obesity with BMI of 43, associated with hypertension.  DESCRIPTION OF PROCEDURE: The patient was brought to the Operating Room and placed in the supine position. General anesthesia was obtained with orotracheal intubation. The patient had a Foley catheter inserted sterily. TED hose and Thromboguards were applied and a footboard applied at the end of the operative bed. The patient then had prep and drape of the lower chest and abdomen. A 5 mm Optiview trocar was introduced under direct visualization into the left lateral abdomen. Pneumoperitoneum was obtained with carbon dioxide. Next, three additional trocars introduced across the upper abdomen. The patient also had a Nathanson liver retractor introduced through a subxiphoid wound. Upon elevation of the left lobe of the liver there was no overt evidence of a hiatal hernia. The patient had division of vascular pedicles along the greater curvature of the stomach beginning approximately 4 cm proximal to the pylorus. This was then extended through the entire gastrocolic and gastrosplenic ligament. The undersurface of the left hemidiaphragm was freed from the upper fundus by division of lymphovascular attachments. Ultimately, the region of the angle of His was identified. There was again, no evidence of a hiatal hernia appreciated. Posterior attachments to the pancreas were taken down by division of the ligamentous attachments or peritoneal attachments. The patient at this point, a 34-French bougie passed transorally. This was directed in the level of the antrum. The patient had a series of GIA staplers fired to create a medially-based gastric  tube effect. The first firing was a relative transverse direction in an effort to avoid narrowing at the level of the incisura. Next, a vertical line of staples was placed parallel to the lesser curvature and brought out just lateral to the angle of His. The staple line noted to be consistent from one firing to the other. The patient had approximation of portions of the divided gastrosplenic and gastrocolic ligament to the side margins of the gastric sleeve with interrupted 2-0 Ethibond suture. This was done to prevent any over rotation of the gastric tube effect. Next, the lateral stomach was delivered through the trocar defect in the right upper quadrant after digital dilation performed. The trocar was a 12 mm defect. After removal of the stomach, the fascia and peritoneum of this wound was closed with 0-Vicryl suture, as passed by a needle suture passing system. The wounds were injected with 0.25% Marcaine followed by 4-0 Monocryl in the dermis, followed by Dermabond. The patient was allowed to recover at this point having tolerated the procedure well. There was minimal blood loss.    ____________________________ Tyrone AppleMichael A. Alva Garnetyner, MD mat:eg D: 07/26/2012 18:29:42 ET T: 07/28/2012 17:10:25 ET JOB#: [id number]  cc:  1.  Casimiro NeedleMichael A. Alva Garnetyner, MD, <Dictator> 2.  Eustaquio BoydenJavier Gutierrez, MD Everette RankMICHAEL A TYNER MD ELECTRONICALLY SIGNED 08/27/2012 15:41

## 2015-01-06 ENCOUNTER — Other Ambulatory Visit: Payer: Self-pay | Admitting: Family Medicine

## 2015-01-26 ENCOUNTER — Other Ambulatory Visit: Payer: Self-pay | Admitting: Family Medicine

## 2015-07-29 ENCOUNTER — Telehealth: Payer: Self-pay | Admitting: *Deleted

## 2015-07-29 NOTE — Telephone Encounter (Signed)
Med interaction form in your IN box for review.

## 2015-08-02 NOTE — Telephone Encounter (Signed)
Noted NSAID decreases effectiveness of ARB.

## 2015-10-18 ENCOUNTER — Other Ambulatory Visit: Payer: Self-pay | Admitting: Family Medicine

## 2015-11-04 ENCOUNTER — Ambulatory Visit (INDEPENDENT_AMBULATORY_CARE_PROVIDER_SITE_OTHER): Payer: BLUE CROSS/BLUE SHIELD | Admitting: Family Medicine

## 2015-11-04 ENCOUNTER — Encounter: Payer: Self-pay | Admitting: Family Medicine

## 2015-11-04 VITALS — BP 110/76 | HR 94 | Temp 97.3°F | Wt 200.5 lb

## 2015-11-04 DIAGNOSIS — E611 Iron deficiency: Secondary | ICD-10-CM

## 2015-11-04 DIAGNOSIS — I10 Essential (primary) hypertension: Secondary | ICD-10-CM

## 2015-11-04 DIAGNOSIS — E039 Hypothyroidism, unspecified: Secondary | ICD-10-CM | POA: Diagnosis not present

## 2015-11-04 DIAGNOSIS — E669 Obesity, unspecified: Secondary | ICD-10-CM | POA: Diagnosis not present

## 2015-11-04 DIAGNOSIS — F418 Other specified anxiety disorders: Secondary | ICD-10-CM

## 2015-11-04 MED ORDER — VALSARTAN 40 MG PO TABS: 40.0000 mg | ORAL_TABLET | Freq: Every day | ORAL | Status: DC

## 2015-11-04 NOTE — Patient Instructions (Addendum)
Try lower diovan dose - 40mg  daily sent to pharmacy. Continue thyroid medicine. meds refilled today. labwork today. You are doing great! Return as needed or in 1 year for a physical.

## 2015-11-04 NOTE — Assessment & Plan Note (Signed)
Feels stable on current regimen, desires to continue. Refilled.

## 2015-11-04 NOTE — Assessment & Plan Note (Signed)
Check TSH today. Refilled today.

## 2015-11-04 NOTE — Assessment & Plan Note (Signed)
Now just taking MVI with iron.

## 2015-11-04 NOTE — Progress Notes (Signed)
Pre visit review using our clinic review tool, if applicable. No additional management support is needed unless otherwise documented below in the visit note. 

## 2015-11-04 NOTE — Progress Notes (Signed)
BP 110/76 mmHg  Pulse 94  Temp(Src) 97.3 F (36.3 C) (Oral)  Wt 200 lb 8 oz (90.946 kg)  SpO2 98%   CC: f/u visit  Subjective:    Patient ID: Diana Cox, female    DOB: 1971/03/16, 45 y.o.   MRN: [id number]  HPI: Diana Cox is a 45 y.o. female presenting on 11/04/2015 for Follow-up   Weight loss noted - s/p sleeve gastrectomy. Continued weight loss noted.  Depression with anxiety - last visit we increased effexor to  twice daily. On this med since 2011.   HTN - Compliant with current antihypertensive regimen of diovan  daily.  Does not check blood pressures at home.  No low blood pressure readings or symptoms of dizziness/syncope.  Denies HA, vision changes, CP/tightness, SOB, leg swelling.    IDA - on iron tablet daily  Hypothyroid - compliant with thyroid medication.   Relevant past medical, surgical, family and social history reviewed and updated as indicated. Interim medical history since our last visit reviewed. Allergies and medications reviewed and updated. Current Outpatient Prescriptions on File Prior to Visit  Medication Sig  . levothyroxine (SYNTHROID, LEVOTHROID) 88 MCG tablet TAKE 1 TABLET BY MOUTH DAILY  . venlafaxine (EFFEXOR) 100 MG tablet Take 1 tablet (100 mg total) by mouth 2 (two) times daily. **MUST HAVE PHYSICAL FOR FURTHER REFILLS**   No current facility-administered medications on file prior to visit.    Review of Systems Per HPI unless specifically indicated in ROS section     Objective:    BP 110/76 mmHg  Pulse 94  Temp(Src) 97.3 F (36.3 C) (Oral)  Wt 200 lb 8 oz (90.946 kg)  SpO2 98%  Wt Readings from Last 3 Encounters:  11/04/15 200 lb 8 oz (90.946 kg)  10/06/14 206 lb 12 oz (93.781 kg)  12/10/13 209 lb 12 oz (95.142 kg)   Body mass index is 32.38 kg/(m^2).  Physical Exam  Constitutional: She appears well-developed and well-nourished. No distress.  HENT:  Mouth/Throat: Oropharynx is clear and moist. No  oropharyngeal exudate.  Neck: Normal range of motion. Neck supple. No thyromegaly present.  Cardiovascular: Normal rate, regular rhythm, normal heart sounds and intact distal pulses.   No murmur heard. Pulmonary/Chest: Effort normal and breath sounds normal. No respiratory distress. She has no wheezes. She has no rales.  Musculoskeletal: She exhibits no edema.  Psychiatric: She has a normal mood and affect. Her behavior is normal. Judgment and thought content normal.  Nursing note and vitals reviewed.  Results for orders placed or performed in visit on 10/06/14  Comprehensive metabolic panel  Result Value Ref Range   Sodium 138 135 - 145 mEq/L   Potassium 4.3 3.5 - 5.1 mEq/L   Chloride 102 96 - 112 mEq/L   CO2 32 19 - 32 mEq/L   Glucose, Bld 97 70 - 99 mg/dL   BUN 14 6 - 23 mg/dL   Creatinine, Ser 4.09 0.40 - 1.20 mg/dL   Total Bilirubin 0.4 0.2 - 1.2 mg/dL   Alkaline Phosphatase 72 39 - 117 U/L   AST 20 0 - 37 U/L   ALT 15 0 - 35 U/L   Total Protein 7.1 6.0 - 8.3 g/dL   Albumin 4.2 3.5 - 5.2 g/dL   Calcium 9.5 8.4 - 81.1 mg/dL   GFR 914.78 >29.56 mL/min  CBC with Differential/Platelet  Result Value Ref Range   WBC 3.5 (L) 4.0 - 10.5 K/uL   RBC 4.52 3.87 - 5.11  Mil/uL   Hemoglobin 13.3 12.0 - 15.0 g/dL   HCT 16.140.3 09.636.0 - 04.546.0 %   MCV 89.2 78.0 - 100.0 fl   MCHC 33.0 30.0 - 36.0 g/dL   RDW 40.914.1 81.111.5 - 91.415.5 %   Platelets 282.0 150.0 - 400.0 K/uL   Neutrophils Relative % 54.8 43.0 - 77.0 %   Lymphocytes Relative 35.9 12.0 - 46.0 %   Monocytes Relative 7.4 3.0 - 12.0 %   Eosinophils Relative 1.3 0.0 - 5.0 %   Basophils Relative 0.6 0.0 - 3.0 %   Neutro Abs 1.9 1.4 - 7.7 K/uL   Lymphs Abs 1.2 0.7 - 4.0 K/uL   Monocytes Absolute 0.3 0.1 - 1.0 K/uL   Eosinophils Absolute 0.0 0.0 - 0.7 K/uL   Basophils Absolute 0.0 0.0 - 0.1 K/uL  T4, free  Result Value Ref Range   Free T4 0.95 0.60 - 1.60 ng/dL  TSH  Result Value Ref Range   TSH 0.38 0.35 - 4.50 uIU/mL  Vitamin B12    Result Value Ref Range   Vitamin B-12 >1500 (H) 211 - 911 pg/mL  Ferritin  Result Value Ref Range   Ferritin 10.2 10.0 - 291.0 ng/mL  IBC panel  Result Value Ref Range   Iron 60 42 - 145 ug/dL   Transferrin 782.9292.0 562.1212.0 - 360.0 mg/dL   Saturation Ratios 30.814.7 (L) 20.0 - 50.0 %  Folate  Result Value Ref Range   Folate >24.6 >5.9 ng/mL  Vit D  25 hydroxy (rtn osteoporosis monitoring)  Result Value Ref Range   VITD 24.16 (L) 30.00 - 100.00 ng/mL  Magnesium  Result Value Ref Range   Magnesium 2.1 1.5 - 2.5 mg/dL  Amylase  Result Value Ref Range   Amylase 52 27 - 131 U/L  Phosphorus  Result Value Ref Range   Phosphorus 2.7 2.3 - 4.6 mg/dL  Vitamin B1  Result Value Ref Range   Vitamin B1 (Thiamine) 8 8 - 30 nmol/L  Prealbumin  Result Value Ref Range   Prealbumin 19.2 17.0 - 34.0 mg/dL  Lipid panel  Result Value Ref Range   Cholesterol 185 0 - 200 mg/dL   Triglycerides 65.758.0 0.0 - 149.0 mg/dL   HDL 84.6954.70 >62.95>39.00 mg/dL   VLDL 28.411.6 0.0 - 13.240.0 mg/dL   LDL Cholesterol 440119 (H) 0 - 99 mg/dL   Total CHOL/HDL Ratio 3    NonHDL 130.30       Assessment & Plan:   Problem List Items Addressed This Visit    Depression with anxiety    Feels stable on current regimen, desires to continue. Refilled.      HTN (hypertension) - Primary    Chronic, stable. Continue current regimen.      Relevant Medications   valsartan (DIOVAN) 40 MG tablet   Other Relevant Orders   Basic metabolic panel   Hypothyroid    Check TSH today. Refilled today.      Relevant Orders   TSH   Obesity, Class I, BMI 30-34.9    Continued weight loss, bariatric surgery labs monitored by Dr Alva Garnetyner bariatric surgeon.      Iron deficiency    Now just taking MVI with iron.          Follow up plan: Return in about 1 year (around 11/03/2016), or as needed, for annual exam, prior fasting for blood work.

## 2015-11-04 NOTE — Assessment & Plan Note (Addendum)
Continued weight loss, bariatric surgery labs monitored by Dr Alva Garnetyner bariatric surgeon.

## 2015-11-04 NOTE — Assessment & Plan Note (Signed)
Chronic, stable. Continue current regimen. 

## 2015-11-05 LAB — BASIC METABOLIC PANEL
BUN: 18 mg/dL (ref 6–23)
CHLORIDE: 103 meq/L (ref 96–112)
CO2: 32 mEq/L (ref 19–32)
Calcium: 9.5 mg/dL (ref 8.4–10.5)
Creatinine, Ser: 0.72 mg/dL (ref 0.40–1.20)
GFR: 112.29 mL/min (ref >60.00)
GLUCOSE: 126 mg/dL — AB (ref 70–99)
POTASSIUM: 4.2 meq/L (ref 3.5–5.1)
Sodium: 141 mEq/L (ref 135–145)

## 2015-11-05 LAB — TSH: TSH: 1.12 u[IU]/mL (ref 0.35–4.50)

## 2015-11-20 ENCOUNTER — Other Ambulatory Visit: Payer: Self-pay | Admitting: Family Medicine

## 2016-01-27 ENCOUNTER — Other Ambulatory Visit: Payer: Self-pay | Admitting: Family Medicine

## 2016-05-27 ENCOUNTER — Other Ambulatory Visit: Payer: Self-pay | Admitting: Family Medicine

## 2016-10-06 ENCOUNTER — Other Ambulatory Visit: Payer: Self-pay | Admitting: Family Medicine

## 2016-10-06 ENCOUNTER — Encounter: Payer: Self-pay | Admitting: *Deleted

## 2016-10-30 ENCOUNTER — Ambulatory Visit (INDEPENDENT_AMBULATORY_CARE_PROVIDER_SITE_OTHER): Payer: BLUE CROSS/BLUE SHIELD | Admitting: Family Medicine

## 2016-10-30 ENCOUNTER — Encounter: Payer: Self-pay | Admitting: Family Medicine

## 2016-10-30 VITALS — BP 130/88 | HR 84 | Temp 98.4°F | Wt 193.8 lb

## 2016-10-30 DIAGNOSIS — E669 Obesity, unspecified: Secondary | ICD-10-CM | POA: Diagnosis not present

## 2016-10-30 DIAGNOSIS — I10 Essential (primary) hypertension: Secondary | ICD-10-CM | POA: Diagnosis not present

## 2016-10-30 DIAGNOSIS — E559 Vitamin D deficiency, unspecified: Secondary | ICD-10-CM

## 2016-10-30 DIAGNOSIS — F418 Other specified anxiety disorders: Secondary | ICD-10-CM

## 2016-10-30 DIAGNOSIS — E039 Hypothyroidism, unspecified: Secondary | ICD-10-CM | POA: Diagnosis not present

## 2016-10-30 DIAGNOSIS — R739 Hyperglycemia, unspecified: Secondary | ICD-10-CM

## 2016-10-30 DIAGNOSIS — Z9884 Bariatric surgery status: Secondary | ICD-10-CM | POA: Diagnosis not present

## 2016-10-30 LAB — BASIC METABOLIC PANEL
BUN: 13 mg/dL (ref 6–23)
CHLORIDE: 102 meq/L (ref 96–112)
CO2: 31 mEq/L (ref 19–32)
Calcium: 9.2 mg/dL (ref 8.4–10.5)
Creatinine, Ser: 0.71 mg/dL (ref 0.40–1.20)
GFR: 113.62 mL/min (ref >60.00)
Glucose, Bld: 84 mg/dL (ref 70–99)
POTASSIUM: 4 meq/L (ref 3.5–5.1)
SODIUM: 138 meq/L (ref 135–145)

## 2016-10-30 LAB — LIPID PANEL
CHOL/HDL RATIO: 4
Cholesterol: 202 mg/dL — ABNORMAL HIGH (ref 0–200)
HDL: 56.1 mg/dL (ref >39.00)
LDL Cholesterol: 133 mg/dL — ABNORMAL HIGH (ref 0–99)
NONHDL: 145.66
Triglycerides: 63 mg/dL (ref 0.0–149.0)
VLDL: 12.6 mg/dL (ref 0.0–40.0)

## 2016-10-30 LAB — CBC WITH DIFFERENTIAL/PLATELET
Basophils Absolute: 0 10*3/uL (ref 0.0–0.1)
Basophils Relative: 1 % (ref 0.0–3.0)
EOS PCT: 1.7 % (ref 0.0–5.0)
Eosinophils Absolute: 0.1 10*3/uL (ref 0.0–0.7)
HEMATOCRIT: 37.4 % (ref 36.0–46.0)
Hemoglobin: 12.4 g/dL (ref 12.0–15.0)
Lymphocytes Relative: 39.7 % (ref 12.0–46.0)
Lymphs Abs: 1.2 10*3/uL (ref 0.7–4.0)
MCHC: 33.1 g/dL (ref 30.0–36.0)
MCV: 89.8 fl (ref 78.0–100.0)
MONOS PCT: 8.4 % (ref 3.0–12.0)
Monocytes Absolute: 0.3 10*3/uL (ref 0.1–1.0)
Neutro Abs: 1.5 10*3/uL (ref 1.4–7.7)
Neutrophils Relative %: 49.2 % (ref 43.0–77.0)
Platelets: 280 10*3/uL (ref 150.0–400.0)
RBC: 4.17 Mil/uL (ref 3.87–5.11)
RDW: 14.1 % (ref 11.5–15.5)
WBC: 3 10*3/uL — AB (ref 4.0–10.5)

## 2016-10-30 LAB — TSH: TSH: 0.72 u[IU]/mL (ref 0.35–4.50)

## 2016-10-30 LAB — HEMOGLOBIN A1C: Hgb A1c MFr Bld: 5.8 % (ref 4.6–6.5)

## 2016-10-30 LAB — VITAMIN D 25 HYDROXY (VIT D DEFICIENCY, FRACTURES): VITD: 29.61 ng/mL — AB (ref 30.00–100.00)

## 2016-10-30 MED ORDER — VENLAFAXINE HCL 100 MG PO TABS: 100.0000 mg | ORAL_TABLET | Freq: Two times a day (BID) | ORAL | 11 refills | Status: DC

## 2016-10-30 MED ORDER — VALSARTAN 40 MG PO TABS: 40.0000 mg | ORAL_TABLET | Freq: Every day | ORAL | 3 refills | Status: DC

## 2016-10-30 MED ORDER — LEVOTHYROXINE SODIUM 88 MCG PO TABS: 88.0000 ug | ORAL_TABLET | Freq: Every day | ORAL | 3 refills | Status: DC

## 2016-10-30 NOTE — Progress Notes (Signed)
Pre visit review using our clinic review tool, if applicable. No additional management support is needed unless otherwise documented below in the visit note. 

## 2016-10-30 NOTE — Assessment & Plan Note (Signed)
Chronic, stable. Continue current regimen. 

## 2016-10-30 NOTE — Assessment & Plan Note (Signed)
Ongoing weight loss noted s/p sleeve gastrectomyu

## 2016-10-30 NOTE — Patient Instructions (Addendum)
Try colace (docusate) stool softener.  Medicines refilled today Labs today - we will be in touch with results. Keep up the good work! Return as needed or in 1 year for next visit

## 2016-10-30 NOTE — Assessment & Plan Note (Addendum)
Chronic, stable. Update TSH. Continue levothyroxine.

## 2016-10-30 NOTE — Progress Notes (Signed)
BP 130/88   Pulse 84   Temp 98.4 F (36.9 C) (Oral)   Wt 193 lb 12 oz (87.9 kg)   LMP 10/23/2016   BMI 31.27 kg/m    CC: med refill visit Subjective:    Patient ID: Diana Cox, female    DOB: Feb 25, 1971, 46 y.o.   MRN: [id number]  HPI: Diana Cox is a 46 y.o. female presenting on 10/30/2016 for Medication Refill   HTN - Compliant with current antihypertensive regimen of diovan 80mg  daily. Does not check blood pressures at home. No low blood pressure readings or symptoms of dizziness/syncope.  Denies HA, vision changes, CP/tightness, SOB, leg swelling.    Hypothyroidism - compliant with thyroid medication. No thyroid symptoms other than intermittent constipation.  Lab Results  Component Value Date   TSH 1.12 11/04/2015     Well woman with Dr Jennette Kettle - 2016.  Mammogram 2016.   Relevant past medical, surgical, family and social history reviewed and updated as indicated. Interim medical history since our last visit reviewed. Allergies and medications reviewed and updated. Outpatient Medications Prior to Visit  Medication Sig Dispense Refill  . Multiple Vitamin (MULTIVITAMIN IRON-FREE PO) Take 1 tablet by mouth daily.    Marland Kitchen levothyroxine (SYNTHROID, LEVOTHROID) 88 MCG tablet TAKE 1 TABLET BY MOUTH DAILY 90 tablet 3  . valsartan (DIOVAN) 40 MG tablet Take 1 tablet (40 mg total) by mouth daily. 30 tablet 11  . venlafaxine (EFFEXOR) 100 MG tablet TAKE 1 TABLET BY MOUTH TWICE A DAY 60 tablet 0   No facility-administered medications prior to visit.      Per HPI unless specifically indicated in ROS section below Review of Systems     Objective:    BP 130/88   Pulse 84   Temp 98.4 F (36.9 C) (Oral)   Wt 193 lb 12 oz (87.9 kg)   LMP 10/23/2016   BMI 31.27 kg/m   Wt Readings from Last 3 Encounters:  10/30/16 193 lb 12 oz (87.9 kg)  11/04/15 200 lb 8 oz (90.9 kg)  10/06/14 206 lb 12 oz (93.8 kg)    Physical Exam  Constitutional: She appears well-developed and  well-nourished. No distress.  HENT:  Mouth/Throat: Oropharynx is clear and moist. No oropharyngeal exudate.  Eyes: Conjunctivae and EOM are normal. Pupils are equal, round, and reactive to light. No scleral icterus.  Neck: Normal range of motion. Neck supple. No thyromegaly present.  Cardiovascular: Normal rate, regular rhythm, normal heart sounds and intact distal pulses.   No murmur heard. Pulmonary/Chest: Effort normal and breath sounds normal. No respiratory distress. She has no wheezes. She has no rales.  Musculoskeletal: She exhibits no edema.  Lymphadenopathy:    She has no cervical adenopathy.  Skin: Skin is warm and dry. No rash noted.  Psychiatric: She has a normal mood and affect.  Nursing note and vitals reviewed.  Results for orders placed or performed in visit on 10/30/16  HM MAMMOGRAPHY  Result Value Ref Range   HM Mammogram Self Reported Normal 0-4 Bi-Rad, Self Reported Normal  HM PAP SMEAR  Result Value Ref Range   HM Pap smear normal per pt Jennette Kettle)       Assessment & Plan:   Problem List Items Addressed This Visit    Bariatric surgery status    Update labs.       Relevant Orders   Lipid panel   CBC with Differential/Platelet   Depression with anxiety    Chronic, stable on  current regimen.  Desires continued      HTN (hypertension) - Primary    Chronic, stable. Continue current regimen.       Relevant Medications   valsartan (DIOVAN) 40 MG tablet   Other Relevant Orders   Lipid panel   Basic metabolic panel   Hyperglycemia   Relevant Orders   Hemoglobin A1c   Hypothyroid    Chronic, stable. Update TSH. Continue levothyroxine.       Relevant Medications   levothyroxine (SYNTHROID, LEVOTHROID) 88 MCG tablet   Other Relevant Orders   TSH   Obesity, Class I, BMI 30-34.9    Ongoing weight loss noted s/p sleeve gastrectomyu       Other Visit Diagnoses    Vitamin D deficiency       Relevant Orders   VITAMIN D 25 Hydroxy (Vit-D Deficiency,  Fractures)       Follow up plan: Return in about 1 year (around 10/30/2017) for follow up visit.  Eustaquio BoydenJavier Etheridge Geil, MD

## 2016-10-30 NOTE — Assessment & Plan Note (Signed)
Update labs.  

## 2016-10-30 NOTE — Assessment & Plan Note (Signed)
Chronic, stable on current regimen.  Desires continued

## 2016-10-31 ENCOUNTER — Encounter: Payer: Self-pay | Admitting: Family Medicine

## 2017-11-09 ENCOUNTER — Encounter: Payer: Self-pay | Admitting: Family Medicine

## 2017-11-14 ENCOUNTER — Encounter: Payer: Self-pay | Admitting: Family Medicine

## 2017-11-14 ENCOUNTER — Ambulatory Visit: Payer: BLUE CROSS/BLUE SHIELD | Admitting: Family Medicine

## 2017-11-14 VITALS — BP 120/80 | HR 100 | Temp 98.5°F | Ht 66.0 in | Wt 203.5 lb

## 2017-11-14 DIAGNOSIS — E611 Iron deficiency: Secondary | ICD-10-CM

## 2017-11-14 DIAGNOSIS — I10 Essential (primary) hypertension: Secondary | ICD-10-CM | POA: Diagnosis not present

## 2017-11-14 DIAGNOSIS — R739 Hyperglycemia, unspecified: Secondary | ICD-10-CM | POA: Diagnosis not present

## 2017-11-14 DIAGNOSIS — E039 Hypothyroidism, unspecified: Secondary | ICD-10-CM

## 2017-11-14 DIAGNOSIS — F418 Other specified anxiety disorders: Secondary | ICD-10-CM

## 2017-11-14 DIAGNOSIS — E66811 Obesity, class 1: Secondary | ICD-10-CM

## 2017-11-14 DIAGNOSIS — Z9884 Bariatric surgery status: Secondary | ICD-10-CM

## 2017-11-14 DIAGNOSIS — E669 Obesity, unspecified: Secondary | ICD-10-CM | POA: Diagnosis not present

## 2017-11-14 MED ORDER — VENLAFAXINE HCL 100 MG PO TABS: 100.0000 mg | ORAL_TABLET | Freq: Two times a day (BID) | ORAL | 3 refills | Status: DC

## 2017-11-14 MED ORDER — DAILY VITE MULTIVITAMIN/IRON PO TABS: 1.0000 | ORAL_TABLET | Freq: Every day | ORAL | Status: DC

## 2017-11-14 MED ORDER — LEVOTHYROXINE SODIUM 88 MCG PO TABS: 88.0000 ug | ORAL_TABLET | Freq: Every day | ORAL | 3 refills | Status: DC

## 2017-11-14 NOTE — Assessment & Plan Note (Addendum)
Chronic, stable. Continue current regimen effexor 100mg  bid. Refilled today.  PHQ9 = 10 GAD7 = 13

## 2017-11-14 NOTE — Progress Notes (Signed)
BP 120/80 (BP Location: Left Arm, Patient Position: Sitting, Cuff Size: Normal)   Pulse 100   Temp 98.5 F (36.9 C) (Oral)   Ht 5\' 6"  (1.676 m)   Wt 203 lb 8 oz (92.3 kg)   LMP 10/24/2017   SpO2 99%   BMI 32.85 kg/m    CC: med refill visit Subjective:    Patient ID: Diana Cox, female    DOB: 1971/06/29, 47 y.o.   MRN: [id number]  HPI: Diana Cox is a 47 y.o. female presenting on 11/14/2017 for Medication Refill   HTN - off diovan 40mg  since recall several months ago - pharmacy advised she stop taking this. Does not check blood pressures at home: occasionally at work and well controlled. No low blood pressure readings or symptoms of dizziness/syncope.  Denies HA, vision changes, CP/tightness, SOB, leg swelling. Already avoids salt in diet. Walks daily for cardio.   Hypothyroidism - compliant with levothyroxine 88mcg daily. Denies hyper or hypothyroid sxs.   Anxiety - some changes at work - noted more irritable then indifferent. Overall feels effexor effective and desires to continue.   3 more classes to finish bachelor's in occupational safety and health.   Relevant past medical, surgical, family and social history reviewed and updated as indicated. Interim medical history since our last visit reviewed. Allergies and medications reviewed and updated. Outpatient Medications Prior to Visit  Medication Sig Dispense Refill  . levothyroxine (SYNTHROID, LEVOTHROID) 88 MCG tablet Take 1 tablet (88 mcg total) by mouth daily. 90 tablet 3  . Multiple Vitamin (MULTIVITAMIN IRON-FREE PO) Take 1 tablet by mouth daily.    . valsartan (DIOVAN) 40 MG tablet Take 1 tablet (40 mg total) by mouth daily. 90 tablet 3  . venlafaxine (EFFEXOR) 100 MG tablet Take 1 tablet (100 mg total) by mouth 2 (two) times daily. 60 tablet 11   No facility-administered medications prior to visit.      Per HPI unless specifically indicated in ROS section below Review of Systems     Objective:    BP 120/80 (BP Location: Left Arm, Patient Position: Sitting, Cuff Size: Normal)   Pulse 100   Temp 98.5 F (36.9 C) (Oral)   Ht 5\' 6"  (1.676 m)   Wt 203 lb 8 oz (92.3 kg)   LMP 10/24/2017   SpO2 99%   BMI 32.85 kg/m   Wt Readings from Last 3 Encounters:  11/14/17 203 lb 8 oz (92.3 kg)  10/30/16 193 lb 12 oz (87.9 kg)  11/04/15 200 lb 8 oz (90.9 kg)    Physical Exam  Constitutional: She appears well-developed and well-nourished. No distress.  HENT:  Head: Normocephalic and atraumatic.  Mouth/Throat: Oropharynx is clear and moist. No oropharyngeal exudate.  Eyes: Pupils are equal, round, and reactive to light. Conjunctivae and EOM are normal.  Neck: Normal range of motion. Neck supple. No thyromegaly present.  Cardiovascular: Normal rate, regular rhythm, normal heart sounds and intact distal pulses.  No murmur heard. Pulmonary/Chest: Effort normal and breath sounds normal. No respiratory distress. She has no wheezes. She has no rales.  Musculoskeletal: Normal range of motion. She exhibits no edema.  Lymphadenopathy:    She has no cervical adenopathy.  Skin: Skin is warm and dry. No rash noted.  Psychiatric: She has a normal mood and affect.  Nursing note and vitals reviewed.  Depression screen PHQ 2/9 11/14/2017  Decreased Interest 1  Down, Depressed, Hopeless 2  PHQ - 2 Score 3  Altered sleeping  0  Tired, decreased energy 2  Change in appetite 2  Feeling bad or failure about yourself  0  Trouble concentrating 3  Moving slowly or fidgety/restless 0  Suicidal thoughts 0  PHQ-9 Score 10   GAD 7 : Generalized Anxiety Score 11/14/2017  Nervous, Anxious, on Edge 3  Control/stop worrying 3  Worry too much - different things 3  Trouble relaxing 2  Restless 0  Easily annoyed or irritable 2  Afraid - awful might happen 0  Total GAD 7 Score 13       Assessment & Plan:   Problem List Items Addressed This Visit    Bariatric surgery status    Update labs.        Relevant Orders   Lipid panel   Vitamin B12   VITAMIN D 25 Hydroxy (Vit-D Deficiency, Fractures)   Folate   Vitamin B1   Depression with anxiety    Chronic, stable. Continue current regimen effexor 100mg  bid. Refilled today.  PHQ9 = 10 GAD7 = 13      Relevant Medications   venlafaxine (EFFEXOR) 100 MG tablet   HTN (hypertension) - Primary    Chronic, stable. She was able to wean off diovan (after recall concerns) and intermittent checks at home have been well controlled. Will remain off. Continues low sodium high potassium diet.       Hyperglycemia    Update A1c      Relevant Orders   Comprehensive metabolic panel   Hemoglobin A1c   Hypothyroid    Chronic, stable. Update TSH. Levothyroxine refilled.       Relevant Medications   levothyroxine (SYNTHROID, LEVOTHROID) 88 MCG tablet   Other Relevant Orders   TSH   Iron deficiency    Update labs. Taking MVI with iron.       Relevant Orders   CBC with Differential/Platelet   Ferritin   IBC panel   Obesity, Class I, BMI 30-34.9    S/p sleeve gastrectomy.           Meds ordered this encounter  Medications  . venlafaxine (EFFEXOR) 100 MG tablet    Sig: Take 1 tablet (100 mg total) by mouth 2 (two) times daily.    Dispense:  180 tablet    Refill:  3  . levothyroxine (SYNTHROID, LEVOTHROID) 88 MCG tablet    Sig: Take 1 tablet (88 mcg total) by mouth daily.    Dispense:  90 tablet    Refill:  3  . Multiple Vitamins-Iron (DAILY VITE MULTIVITAMIN/IRON) TABS    Sig: Take 1 tablet by mouth daily.   Orders Placed This Encounter  Procedures  . Lipid panel  . Comprehensive metabolic panel  . TSH  . Hemoglobin A1c  . CBC with Differential/Platelet  . Vitamin B12  . VITAMIN D 25 Hydroxy (Vit-D Deficiency, Fractures)  . Ferritin  . Folate  . Vitamin B1  . IBC panel    Follow up plan: Return in about 1 year (around 11/15/2018) for follow up visit.  Eustaquio Boyden, MD

## 2017-11-14 NOTE — Assessment & Plan Note (Signed)
Chronic, stable. She was able to wean off diovan (after recall concerns) and intermittent checks at home have been well controlled. Will remain off. Continues low sodium high potassium diet.

## 2017-11-14 NOTE — Assessment & Plan Note (Addendum)
S/p sleeve gastrectomy.

## 2017-11-14 NOTE — Assessment & Plan Note (Signed)
Chronic, stable. Update TSH. Levothyroxine refilled.

## 2017-11-14 NOTE — Assessment & Plan Note (Signed)
Update labs.  

## 2017-11-14 NOTE — Patient Instructions (Addendum)
Labs today  Medicines refilled You are doing well today - ok to stay off diovan!

## 2017-11-14 NOTE — Assessment & Plan Note (Signed)
Update A1c ?

## 2017-11-14 NOTE — Assessment & Plan Note (Signed)
Update labs. Taking MVI with iron.

## 2017-11-15 LAB — COMPREHENSIVE METABOLIC PANEL
ALK PHOS: 73 U/L (ref 39–117)
ALT: 25 U/L (ref 0–35)
AST: 28 U/L (ref 0–37)
Albumin: 3.9 g/dL (ref 3.5–5.2)
BILIRUBIN TOTAL: 0.2 mg/dL (ref 0.2–1.2)
BUN: 16 mg/dL (ref 6–23)
CALCIUM: 9.1 mg/dL (ref 8.4–10.5)
CO2: 29 meq/L (ref 19–32)
CREATININE: 0.84 mg/dL (ref 0.40–1.20)
Chloride: 106 mEq/L (ref 96–112)
GFR: 93.16 mL/min (ref >60.00)
Glucose, Bld: 84 mg/dL (ref 70–99)
Potassium: 4.3 mEq/L (ref 3.5–5.1)
Sodium: 140 mEq/L (ref 135–145)
TOTAL PROTEIN: 6.8 g/dL (ref 6.0–8.3)

## 2017-11-15 LAB — LIPID PANEL
CHOL/HDL RATIO: 3
CHOLESTEROL: 194 mg/dL (ref 0–200)
HDL: 58.4 mg/dL (ref >39.00)
LDL Cholesterol: 119 mg/dL — ABNORMAL HIGH (ref 0–99)
NonHDL: 135.18
TRIGLYCERIDES: 79 mg/dL (ref 0.0–149.0)
VLDL: 15.8 mg/dL (ref 0.0–40.0)

## 2017-11-15 LAB — CBC WITH DIFFERENTIAL/PLATELET
BASOS PCT: 1.7 % (ref 0.0–3.0)
Basophils Absolute: 0.1 10*3/uL (ref 0.0–0.1)
EOS ABS: 0.1 10*3/uL (ref 0.0–0.7)
EOS PCT: 2 % (ref 0.0–5.0)
HCT: 32.3 % — ABNORMAL LOW (ref 36.0–46.0)
Hemoglobin: 10.4 g/dL — ABNORMAL LOW (ref 12.0–15.0)
LYMPHS ABS: 1.5 10*3/uL (ref 0.7–4.0)
Lymphocytes Relative: 37.6 % (ref 12.0–46.0)
MCHC: 32.4 g/dL (ref 30.0–36.0)
MCV: 82.5 fl (ref 78.0–100.0)
MONO ABS: 0.5 10*3/uL (ref 0.1–1.0)
Monocytes Relative: 11.6 % (ref 3.0–12.0)
NEUTROS PCT: 47.1 % (ref 43.0–77.0)
Neutro Abs: 1.9 10*3/uL (ref 1.4–7.7)
Platelets: 339 10*3/uL (ref 150.0–400.0)
RBC: 3.91 Mil/uL (ref 3.87–5.11)
RDW: 16 % — AB (ref 11.5–15.5)
WBC: 4.1 10*3/uL (ref 4.0–10.5)

## 2017-11-15 LAB — IBC PANEL
Iron: 11 ug/dL — ABNORMAL LOW (ref 42–145)
SATURATION RATIOS: 2.3 % — AB (ref 20.0–50.0)
TRANSFERRIN: 343 mg/dL (ref 212.0–360.0)

## 2017-11-15 LAB — HEMOGLOBIN A1C: HEMOGLOBIN A1C: 6.1 % (ref 4.6–6.5)

## 2017-11-15 LAB — FOLATE: Folate: 8.5 ng/mL (ref >5.9)

## 2017-11-15 LAB — VITAMIN B12: VITAMIN B 12: 846 pg/mL (ref 211–911)

## 2017-11-15 LAB — TSH: TSH: 0.21 u[IU]/mL — ABNORMAL LOW (ref 0.35–4.50)

## 2017-11-15 LAB — FERRITIN: Ferritin: 3.9 ng/mL — ABNORMAL LOW (ref 10.0–291.0)

## 2017-11-15 LAB — VITAMIN D 25 HYDROXY (VIT D DEFICIENCY, FRACTURES): VITD: 23.44 ng/mL — ABNORMAL LOW (ref 30.00–100.00)

## 2017-11-17 LAB — VITAMIN B1: VITAMIN B1 (THIAMINE): 7 nmol/L — AB (ref 8–30)

## 2017-11-20 ENCOUNTER — Other Ambulatory Visit: Payer: Self-pay | Admitting: Family Medicine

## 2017-11-20 DIAGNOSIS — E519 Thiamine deficiency, unspecified: Secondary | ICD-10-CM | POA: Insufficient documentation

## 2017-11-20 DIAGNOSIS — E559 Vitamin D deficiency, unspecified: Secondary | ICD-10-CM

## 2017-11-20 MED ORDER — FERROUS SULFATE 325 (65 FE) MG PO TABS: 325.0000 mg | ORAL_TABLET | Freq: Every day | ORAL | Status: AC

## 2017-11-20 MED ORDER — VITAMIN B-1 50 MG PO TABS: 50.0000 mg | ORAL_TABLET | Freq: Every day | ORAL | Status: DC

## 2017-11-20 MED ORDER — LEVOTHYROXINE SODIUM 88 MCG PO TABS
88.0000 ug | ORAL_TABLET | Freq: Every day | ORAL | 3 refills | Status: DC
Start: 2017-11-20 — End: 2018-07-23

## 2017-11-20 MED ORDER — VITAMIN D 50 MCG (2000 UT) PO CAPS: 1.0000 | ORAL_CAPSULE | Freq: Every day | ORAL | Status: DC

## 2018-01-10 ENCOUNTER — Encounter: Payer: Self-pay | Admitting: Family Medicine

## 2018-01-14 ENCOUNTER — Ambulatory Visit: Payer: BLUE CROSS/BLUE SHIELD | Admitting: Family Medicine

## 2018-01-14 ENCOUNTER — Encounter: Payer: Self-pay | Admitting: Family Medicine

## 2018-01-14 VITALS — BP 120/82 | HR 96 | Temp 98.1°F | Ht 66.0 in | Wt 205.5 lb

## 2018-01-14 DIAGNOSIS — Z9884 Bariatric surgery status: Secondary | ICD-10-CM

## 2018-01-14 DIAGNOSIS — D500 Iron deficiency anemia secondary to blood loss (chronic): Secondary | ICD-10-CM | POA: Diagnosis not present

## 2018-01-14 DIAGNOSIS — E559 Vitamin D deficiency, unspecified: Secondary | ICD-10-CM | POA: Diagnosis not present

## 2018-01-14 DIAGNOSIS — E039 Hypothyroidism, unspecified: Secondary | ICD-10-CM | POA: Diagnosis not present

## 2018-01-14 NOTE — Assessment & Plan Note (Signed)
Update vit D level after 2 months of regular 2000 IU replacement daily.

## 2018-01-14 NOTE — Patient Instructions (Signed)
Labs today to recheck these levels.

## 2018-01-14 NOTE — Assessment & Plan Note (Signed)
Update labs after 2 months of regular oral iron replacement.

## 2018-01-14 NOTE — Progress Notes (Signed)
BP 120/82 (BP Location: Left Arm, Patient Position: Sitting, Cuff Size: Normal)   Pulse 96   Temp 98.1 F (36.7 C) (Oral)   Ht 5\' 6"  (1.676 m)   Wt 205 lb 8 oz (93.2 kg)   LMP 01/01/2018   SpO2 98%   BMI 33.17 kg/m    CC: 2 mo f/u visit Subjective:    Patient ID: Diana Cox, female    DOB: May 10, 1971, 47 y.o.   MRN: [id number]  HPI: Diana Cox is a 47 y.o. female presenting on 01/14/2018 for Hypothyroidism (Here for 2 mo f/u. Requests labs.)   See prior note and labs for details. S/p bariatric surgery, labs showed iron deficiency and b1 deficiency. Thyroid was overactive so we decreased to daily, once weekly. Here for recheck labs.   Compliant with medications and supplements. Tolerating well, no constipation.  Energy levels stable.  Notes improvement in PICA.   Relevant past medical, surgical, family and social history reviewed and updated as indicated. Interim medical history since our last visit reviewed. Allergies and medications reviewed and updated. Outpatient Medications Prior to Visit  Medication Sig Dispense Refill  . Cholecalciferol (VITAMIN D) 2000 units CAPS Take 1 capsule (2,000 Units total) by mouth daily. 30 capsule   . ferrous sulfate 325 (65 FE) MG tablet Take 1 tablet (325 mg total) by mouth daily with breakfast.    . levothyroxine (SYNTHROID, LEVOTHROID) 88 MCG tablet Take 1 tablet (88 mcg total) by mouth daily. One day a week take 1/2 tablet ( ) 90 tablet 3  . Multiple Vitamins-Iron (DAILY VITE MULTIVITAMIN/IRON) TABS Take 1 tablet by mouth daily.    Marland Kitchen thiamine (VITAMIN B-1) 50 MG tablet Take 1 tablet (50 mg total) by mouth daily.    Marland Kitchen venlafaxine (EFFEXOR) 100 MG tablet Take 1 tablet (100 mg total) by mouth 2 (two) times daily. 180 tablet 3   No facility-administered medications prior to visit.      Per HPI unless specifically indicated in ROS section below Review of Systems     Objective:    BP 120/82 (BP Location:  Left Arm, Patient Position: Sitting, Cuff Size: Normal)   Pulse 96   Temp 98.1 F (36.7 C) (Oral)   Ht 5\' 6"  (1.676 m)   Wt 205 lb 8 oz (93.2 kg)   LMP 01/01/2018   SpO2 98%   BMI 33.17 kg/m   Wt Readings from Last 3 Encounters:  01/14/18 205 lb 8 oz (93.2 kg)  11/14/17 203 lb 8 oz (92.3 kg)  10/30/16 193 lb 12 oz (87.9 kg)    Physical Exam  Constitutional: She appears well-developed and well-nourished. No distress.  HENT:  Mouth/Throat: Oropharynx is clear and moist. No oropharyngeal exudate.  Eyes: Pupils are equal, round, and reactive to light. Conjunctivae and EOM are normal.  Cardiovascular: Normal rate, regular rhythm and normal heart sounds.  No murmur heard. Pulmonary/Chest: Effort normal and breath sounds normal. No respiratory distress. She has no wheezes. She has no rales.  Musculoskeletal: She exhibits no edema.  Nursing note and vitals reviewed.  Results for orders placed or performed in visit on 11/14/17  Lipid panel  Result Value Ref Range   Cholesterol 194 0 - 200 mg/dL   Triglycerides 69.6 0.0 - 149.0 mg/dL   HDL 29.52 >84.13 mg/dL   VLDL 24.4 0.0 - 01.0 mg/dL   LDL Cholesterol 272 (H) 0 - 99 mg/dL   Total CHOL/HDL Ratio 3    NonHDL 135.18  Comprehensive metabolic panel  Result Value Ref Range   Sodium 140 135 - 145 mEq/L   Potassium 4.3 3.5 - 5.1 mEq/L   Chloride 106 96 - 112 mEq/L   CO2 29 19 - 32 mEq/L   Glucose, Bld 84 70 - 99 mg/dL   BUN 16 6 - 23 mg/dL   Creatinine, Ser 1.61 0.40 - 1.20 mg/dL   Total Bilirubin 0.2 0.2 - 1.2 mg/dL   Alkaline Phosphatase 73 39 - 117 U/L   AST 28 0 - 37 U/L   ALT 25 0 - 35 U/L   Total Protein 6.8 6.0 - 8.3 g/dL   Albumin 3.9 3.5 - 5.2 g/dL   Calcium 9.1 8.4 - 09.6 mg/dL   GFR 04.54 >09.81 mL/min  TSH  Result Value Ref Range   TSH 0.21 (L) 0.35 - 4.50 uIU/mL  Hemoglobin A1c  Result Value Ref Range   Hgb A1c MFr Bld 6.1 4.6 - 6.5 %  CBC with Differential/Platelet  Result Value Ref Range   WBC 4.1 4.0 -  10.5 K/uL   RBC 3.91 3.87 - 5.11 Mil/uL   Hemoglobin 10.4 (L) 12.0 - 15.0 g/dL   HCT 19.1 (L) 47.8 - 29.5 %   MCV 82.5 78.0 - 100.0 fl   MCHC 32.4 30.0 - 36.0 g/dL   RDW 62.1 (H) 30.8 - 65.7 %   Platelets 339.0 150.0 - 400.0 K/uL   Neutrophils Relative % 47.1 43.0 - 77.0 %   Lymphocytes Relative 37.6 12.0 - 46.0 %   Monocytes Relative 11.6 3.0 - 12.0 %   Eosinophils Relative 2.0 0.0 - 5.0 %   Basophils Relative 1.7 0.0 - 3.0 %   Neutro Abs 1.9 1.4 - 7.7 K/uL   Lymphs Abs 1.5 0.7 - 4.0 K/uL   Monocytes Absolute 0.5 0.1 - 1.0 K/uL   Eosinophils Absolute 0.1 0.0 - 0.7 K/uL   Basophils Absolute 0.1 0.0 - 0.1 K/uL  Vitamin B12  Result Value Ref Range   Vitamin B-12 846 211 - 911 pg/mL  VITAMIN D 25 Hydroxy (Vit-D Deficiency, Fractures)  Result Value Ref Range   VITD 23.44 (L) 30.00 - 100.00 ng/mL  Ferritin  Result Value Ref Range   Ferritin 3.9 (L) 10.0 - 291.0 ng/mL  Folate  Result Value Ref Range   Folate 8.5 >5.9 ng/mL  Vitamin B1  Result Value Ref Range   Vitamin B1 (Thiamine) 7 (L) 8 - 30 nmol/L  IBC panel  Result Value Ref Range   Iron 11 (L) 42 - 145 ug/dL   Transferrin 846.9 629.5 - 360.0 mg/dL   Saturation Ratios 2.3 (L) 20.0 - 50.0 %      Assessment & Plan:   Problem List Items Addressed This Visit    Vitamin D deficiency    Update vit D level after 2 months of regular 2000 IU replacement daily.       Relevant Orders   VITAMIN D 25 Hydroxy (Vit-D Deficiency, Fractures)   IDA (iron deficiency anemia)    Update labs after 2 months of regular oral iron replacement.      Relevant Orders   Ferritin   IBC panel   CBC with Differential/Platelet   Hypothyroid - Primary    Update TFTs on lower levothyroxine dose.       Relevant Orders   TSH   T4, free   Bariatric surgery status       No orders of the defined types were placed in this encounter.  Orders Placed This Encounter  Procedures  . Ferritin  . IBC panel  . VITAMIN D 25 Hydroxy (Vit-D  Deficiency, Fractures)  . CBC with Differential/Platelet  . TSH  . T4, free    Follow up plan: Return if symptoms worsen or fail to improve.  Eustaquio BoydenJavier Zafiro Routson, MD

## 2018-01-14 NOTE — Assessment & Plan Note (Signed)
Update TFTs on lower levothyroxine dose.

## 2018-01-15 LAB — CBC WITH DIFFERENTIAL/PLATELET
BASOS ABS: 0.1 10*3/uL (ref 0.0–0.1)
Basophils Relative: 1.5 % (ref 0.0–3.0)
Eosinophils Absolute: 0.1 10*3/uL (ref 0.0–0.7)
Eosinophils Relative: 2.3 % (ref 0.0–5.0)
HCT: 37.9 % (ref 36.0–46.0)
Hemoglobin: 12.5 g/dL (ref 12.0–15.0)
LYMPHS ABS: 1.4 10*3/uL (ref 0.7–4.0)
Lymphocytes Relative: 31.6 % (ref 12.0–46.0)
MCHC: 33 g/dL (ref 30.0–36.0)
MCV: 87.6 fl (ref 78.0–100.0)
MONO ABS: 0.3 10*3/uL (ref 0.1–1.0)
MONOS PCT: 7.4 % (ref 3.0–12.0)
NEUTROS ABS: 2.6 10*3/uL (ref 1.4–7.7)
NEUTROS PCT: 57.2 % (ref 43.0–77.0)
PLATELETS: 284 10*3/uL (ref 150.0–400.0)
RBC: 4.32 Mil/uL (ref 3.87–5.11)
RDW: 19.3 % — ABNORMAL HIGH (ref 11.5–15.5)
WBC: 4.6 10*3/uL (ref 4.0–10.5)

## 2018-01-15 LAB — VITAMIN D 25 HYDROXY (VIT D DEFICIENCY, FRACTURES): VITD: 25.84 ng/mL — ABNORMAL LOW (ref 30.00–100.00)

## 2018-01-15 LAB — IBC PANEL
IRON: 62 ug/dL (ref 42–145)
Saturation Ratios: 15.1 % — ABNORMAL LOW (ref 20.0–50.0)
TRANSFERRIN: 293 mg/dL (ref 212.0–360.0)

## 2018-01-15 LAB — FERRITIN: FERRITIN: 12.7 ng/mL (ref 10.0–291.0)

## 2018-01-15 LAB — T4, FREE: Free T4: 0.88 ng/dL (ref 0.60–1.60)

## 2018-01-15 LAB — TSH: TSH: 0.87 u[IU]/mL (ref 0.35–4.50)

## 2018-01-16 ENCOUNTER — Other Ambulatory Visit: Payer: Self-pay | Admitting: Family Medicine

## 2018-07-23 ENCOUNTER — Other Ambulatory Visit: Payer: Self-pay | Admitting: Family Medicine

## 2018-07-31 NOTE — Progress Notes (Addendum)
BP 120/84 (BP Location: Left Arm, Patient Position: Sitting, Cuff Size: Normal)   Pulse 82   Temp 97.8 F (36.6 C) (Oral)   Ht 5\' 6"  (1.676 m)   Wt 202 lb 8 oz (91.9 kg)   LMP 07/26/2018   SpO2 99%   BMI 32.68 kg/m    CC: R leg/calf pain Subjective:    Patient ID: Diana Cox, female    DOB: [birth year], 47 y.o.   MRN: [id number]  HPI: Diana Cox is a 47 y.o. female presenting on 08/01/2018 for Leg Pain (C/o right lower leg pain. Pain comes and goes starting at knee radiating to the ankle. Wearing compression stockings helps. Started about 1 mo ago. )   She started kickboxing/fitness program early 06/2018. Over the past month, noticing R>L knee pain as well as R calf swelling down to ankle (now better). Describes pain below kneecap. Denies popliteal pain. Treated at home with compression stockings. She continues kickboxing - she has really enjoyed this!   No fevers/chills, redness. Some warmth and pain. Denies chest pain or dyspnea.      Relevant past medical, surgical, family and social history reviewed and updated as indicated. Interim medical history since our last visit reviewed. Allergies and medications reviewed and updated. Outpatient Medications Prior to Visit  Medication Sig Dispense Refill  . Cholecalciferol (VITAMIN D) 2000 units CAPS Take 1 capsule (2,000 Units total) by mouth daily. 30 capsule   . ferrous sulfate 325 (65 FE) MG tablet Take 1 tablet (325 mg total) by mouth daily with breakfast.    . levothyroxine (SYNTHROID, LEVOTHROID) 88 MCG tablet TAKE 1 TABLET BY MOUTH EVERY DAY 30 tablet 0  . Multiple Vitamins-Iron (DAILY VITE MULTIVITAMIN/IRON) TABS Take 1 tablet by mouth daily.    Marland Kitchen. thiamine (VITAMIN B-1) 50 MG tablet Take 1 tablet (50 mg total) by mouth daily.    Marland Kitchen. venlafaxine (EFFEXOR) 100 MG tablet Take 1 tablet (100 mg total) by mouth 2 (two) times daily. 180 tablet 3   No facility-administered medications prior to visit.      Per HPI unless  specifically indicated in ROS section below Review of Systems Objective:    BP 120/84 (BP Location: Left Arm, Patient Position: Sitting, Cuff Size: Normal)   Pulse 82   Temp 97.8 F (36.6 C) (Oral)   Ht 5\' 6"  (1.676 m)   Wt 202 lb 8 oz (91.9 kg)   LMP 07/26/2018   SpO2 99%   BMI 32.68 kg/m   Wt Readings from Last 3 Encounters:  08/01/18 202 lb 8 oz (91.9 kg)  01/14/18 205 lb 8 oz (93.2 kg)  11/14/17 203 lb 8 oz (92.3 kg)    Physical Exam Vitals signs and nursing note reviewed.  Constitutional:      General: She is not in acute distress.    Appearance: Normal appearance.  Musculoskeletal: Normal range of motion.        General: No swelling.     Right lower leg: No edema.     Left lower leg: No edema.     Comments: L knee WNL L calf circ 42cm R knee exam: No deformity on inspection. No pain with palpation of knee landmarks. No effusion/swelling noted. FROM in flex/extension without crepitus. No popliteal fullness. Neg drawer test. Neg mcmurray test. No pain with valgus/varus stress. Mild discomfort with PFgrind on right. No abnormal patellar mobility.  R calf circ 42.5cm  Skin:    General: Skin is warm and dry.  Capillary Refill: Capillary refill takes less than 2 seconds.     Findings: No rash.  Neurological:     General: No focal deficit present.     Mental Status: She is alert.  Psychiatric:        Mood and Affect: Mood normal.        Assessment & Plan:   Problem List Items Addressed This Visit    Anterior knee pain, right - Primary    Anticipate R PFPS. Exercises provided. Conservative treatment recommended per instructions. Given initial swelling noted, prudent to check venous US r/o dvt eval baker's cyst. Pt agrees with plan.        Other Visit Diagnoses    Need for influenza vaccination       Relevant Orders   Flu Vaccine QUAD 36+ mos IM (Completed)   Right leg swelling       Relevant Orders   VAS US LOWER EXTREMITY VENOUS (DVT)        No orders of the defined types were placed in this encounter.  Orders Placed This Encounter  Procedures  . Flu Vaccine QUAD 36+ mos IM   Patient Instructions  Flu shot today I think you have patellofemoral pain syndrome or runner's knee. Do exercises provided, continue knee sleeve braces when active for extra support. May use ibuprofen as needed, ice or heating pad, leg elevation.  Given one sided swelling that was present, we will check R leg ultrasound r/o blood clot.    Follow up plan: Return if symptoms worsen or fail to improve.  Eustaquio BoydenJavier Harjot Dibello, MD

## 2018-08-01 ENCOUNTER — Ambulatory Visit: Payer: BLUE CROSS/BLUE SHIELD | Admitting: Family Medicine

## 2018-08-01 ENCOUNTER — Encounter: Payer: Self-pay | Admitting: Family Medicine

## 2018-08-01 VITALS — BP 120/84 | HR 82 | Temp 97.8°F | Ht 66.0 in | Wt 202.5 lb

## 2018-08-01 DIAGNOSIS — Z23 Encounter for immunization: Secondary | ICD-10-CM | POA: Diagnosis not present

## 2018-08-01 DIAGNOSIS — M25561 Pain in right knee: Secondary | ICD-10-CM

## 2018-08-01 DIAGNOSIS — M7989 Other specified soft tissue disorders: Secondary | ICD-10-CM

## 2018-08-01 NOTE — Addendum Note (Signed)
Addended by: Eustaquio BoydenGUTIERREZ, Dorothea Yow on: 08/01/2018 09:12 AM   Modules accepted: Orders

## 2018-08-01 NOTE — Patient Instructions (Addendum)
Flu shot today I think you have patellofemoral pain syndrome or runner's knee. Do exercises provided, continue knee sleeve braces when active for extra support. May use ibuprofen as needed, ice or heating pad, leg elevation.  Given one sided swelling that was present, we will check R leg ultrasound r/o blood clot.

## 2018-08-01 NOTE — Assessment & Plan Note (Signed)
Anticipate R PFPS. Exercises provided. Conservative treatment recommended per instructions. Given initial swelling noted, prudent to check venous US r/o dvt eval baker's cyst. Pt agrees with plan.

## 2018-08-02 ENCOUNTER — Encounter: Payer: Self-pay | Admitting: Family Medicine

## 2018-08-05 MED ORDER — VENLAFAXINE HCL 100 MG PO TABS: 100.0000 mg | ORAL_TABLET | Freq: Two times a day (BID) | ORAL | 1 refills | Status: DC

## 2018-08-06 ENCOUNTER — Ambulatory Visit (INDEPENDENT_AMBULATORY_CARE_PROVIDER_SITE_OTHER): Payer: BLUE CROSS/BLUE SHIELD

## 2018-08-06 DIAGNOSIS — M7989 Other specified soft tissue disorders: Secondary | ICD-10-CM

## 2018-08-21 ENCOUNTER — Telehealth: Payer: Self-pay | Admitting: Family Medicine

## 2018-08-21 NOTE — Telephone Encounter (Signed)
E-scribed refill.  Please schedule annual CPE. 

## 2018-08-22 NOTE — Telephone Encounter (Signed)
Labs 3/20 cpx 3/25 Pt aware

## 2018-08-22 NOTE — Telephone Encounter (Signed)
Noted  

## 2018-10-22 ENCOUNTER — Other Ambulatory Visit: Payer: Self-pay

## 2018-10-22 ENCOUNTER — Ambulatory Visit: Payer: BLUE CROSS/BLUE SHIELD | Admitting: Family Medicine

## 2018-10-22 ENCOUNTER — Encounter: Payer: Self-pay | Admitting: Family Medicine

## 2018-10-22 VITALS — BP 130/84 | HR 88 | Temp 98.8°F | Ht 65.0 in | Wt 204.3 lb

## 2018-10-22 DIAGNOSIS — B9689 Other specified bacterial agents as the cause of diseases classified elsewhere: Secondary | ICD-10-CM

## 2018-10-22 DIAGNOSIS — J019 Acute sinusitis, unspecified: Secondary | ICD-10-CM

## 2018-10-22 MED ORDER — AMOXICILLIN 875 MG PO TABS: 875.0000 mg | ORAL_TABLET | Freq: Two times a day (BID) | ORAL | 0 refills | Status: DC

## 2018-10-22 MED ORDER — GUAIFENESIN-CODEINE 100-10 MG/5ML PO SYRP: 5.0000 mL | ORAL_SOLUTION | Freq: Two times a day (BID) | ORAL | 0 refills | Status: DC | PRN

## 2018-10-22 NOTE — Progress Notes (Signed)
BP 130/84 (BP Location: Left Arm, Patient Position: Sitting, Cuff Size: Normal)   Pulse 88   Temp 98.8 F (37.1 C) (Oral)   Ht 5\' 5"  (1.651 m)   Wt 204 lb 5 oz (92.7 kg)   LMP 09/26/2018   SpO2 99%   BMI 34.00 kg/m    CC: sinus pressure, L ear pain, cough.  Subjective:    Patient ID: Diana Cox, female    DOB: 04-15-1971, 48 y.o.   MRN: [id number]  HPI: Diana Cox is a 48 y.o. female presenting on 10/22/2018 for Sinus Problem (C/o sinus pressure, drainage, left ear pain and cough. Sxs started last wk. Seen by NP at job and told allergies. )   1 wk h/o L earache, productive cough, bilateral maxillary sinus pressure, fever Tmax 103 over weekend, ST, PNdrainage. Cough productive of thick dark yellow mucous. Coughing fits affecting sleep.   No tooth pain, dyspnea or wheezing, body aches.   Partner smokes at home.  No sick contacts at home.  No h/o asthma.  Grandson sick at home with pink eye - improved.   Seen at work NP - told allergies - treated with alka seltzer cold/sinus and nasal saline without much benefit.   No recent sinus infection.      Relevant past medical, surgical, family and social history reviewed and updated as indicated. Interim medical history since our last visit reviewed. Allergies and medications reviewed and updated. Outpatient Medications Prior to Visit  Medication Sig Dispense Refill  . Cholecalciferol (VITAMIN D) 2000 units CAPS Take 1 capsule (2,000 Units total) by mouth daily. 30 capsule   . ferrous sulfate 325 (65 FE) MG tablet Take 1 tablet (325 mg total) by mouth daily with breakfast.    . levothyroxine (SYNTHROID, LEVOTHROID) 88 MCG tablet TAKE 1 TABLET BY MOUTH EVERY DAY 30 tablet 1  . Multiple Vitamins-Iron (DAILY VITE MULTIVITAMIN/IRON) TABS Take 1 tablet by mouth daily.    Marland Kitchen thiamine (VITAMIN B-1) 50 MG tablet Take 1 tablet (50 mg total) by mouth daily.    Marland Kitchen venlafaxine (EFFEXOR) 100 MG tablet Take 1 tablet (100 mg total) by  mouth 2 (two) times daily. 180 tablet 1   No facility-administered medications prior to visit.      Per HPI unless specifically indicated in ROS section below Review of Systems Objective:    BP 130/84 (BP Location: Left Arm, Patient Position: Sitting, Cuff Size: Normal)   Pulse 88   Temp 98.8 F (37.1 C) (Oral)   Ht 5\' 5"  (1.651 m)   Wt 204 lb 5 oz (92.7 kg)   LMP 09/26/2018   SpO2 99%   BMI 34.00 kg/m   Wt Readings from Last 3 Encounters:  10/22/18 204 lb 5 oz (92.7 kg)  08/01/18 202 lb 8 oz (91.9 kg)  01/14/18 205 lb 8 oz (93.2 kg)    Physical Exam Vitals signs and nursing note reviewed.  Constitutional:      General: She is not in acute distress.    Appearance: Normal appearance. She is well-developed.  HENT:     Head: Normocephalic and atraumatic.     Right Ear: Hearing, tympanic membrane, ear canal and external ear normal.     Left Ear: Hearing, tympanic membrane, ear canal and external ear normal.     Ears:     Comments: Hypermobile R TMJ without pain    Nose: Mucosal edema (and erythema L>R) and congestion present. No rhinorrhea.     Right  Sinus: No maxillary sinus tenderness or frontal sinus tenderness.     Left Sinus: No maxillary sinus tenderness or frontal sinus tenderness.     Comments: No sinus tenderness to palpation    Mouth/Throat:     Mouth: Mucous membranes are moist.     Pharynx: Oropharynx is clear. Uvula midline. No oropharyngeal exudate or posterior oropharyngeal erythema.     Tonsils: No tonsillar abscesses.  Eyes:     General: No scleral icterus.    Conjunctiva/sclera: Conjunctivae normal.     Pupils: Pupils are equal, round, and reactive to light.  Neck:     Musculoskeletal: Normal range of motion and neck supple.  Cardiovascular:     Rate and Rhythm: Normal rate and regular rhythm.     Pulses: Normal pulses.     Heart sounds: Normal heart sounds. No murmur.  Pulmonary:     Effort: Pulmonary effort is normal. No respiratory distress.      Breath sounds: Normal breath sounds. No wheezing, rhonchi or rales.  Lymphadenopathy:     Cervical: No cervical adenopathy.  Skin:    General: Skin is warm and dry.     Findings: No rash.  Neurological:     Mental Status: She is alert.       Assessment & Plan:   Problem List Items Addressed This Visit    Acute bacterial sinusitis - Primary    Will treat with amoxicillin given progression of symptoms (high fever recently). Supportive care reviewed as per instructions. Update if not improving with treatment.       Relevant Medications   amoxicillin (AMOXIL) 875 MG tablet   guaiFENesin-codeine (CHERATUSSIN AC) 100-10 MG/5ML syrup       Meds ordered this encounter  Medications  . amoxicillin (AMOXIL) 875 MG tablet    Sig: Take 1 tablet (875 mg total) by mouth 2 (two) times daily.    Dispense:  20 tablet    Refill:  0  . guaiFENesin-codeine (CHERATUSSIN AC) 100-10 MG/5ML syrup    Sig: Take 5 mLs by mouth 2 (two) times daily as needed for cough (sedation precautions).    Dispense:  120 mL    Refill:  0   No orders of the defined types were placed in this encounter.   Patient Instructions  You have a sinus infection. Take medicine as prescribed: amoxicillin 875mg  twice daily for 10 days. If not improving after several days, call me.  Push fluids and plenty of rest. Nasal saline irrigation or neti pot to help drain sinuses. May use plain mucinex (guaifenesin) with plenty of fluid to help mobilize mucous. Cheratussin (codeine) cough medicine for night time.  Please let us know if fever >101.5, trouble opening/closing mouth, difficulty swallowing, or worsening instead of improving as expected.    Follow up plan: Return if symptoms worsen or fail to improve.  Eustaquio Boyden, MD

## 2018-10-22 NOTE — Assessment & Plan Note (Signed)
Will treat with amoxicillin given progression of symptoms (high fever recently). Supportive care reviewed as per instructions. Update if not improving with treatment.

## 2018-10-22 NOTE — Patient Instructions (Signed)
You have a sinus infection. Take medicine as prescribed: amoxicillin 875mg  twice daily for 10 days. If not improving after several days, call me.  Push fluids and plenty of rest. Nasal saline irrigation or neti pot to help drain sinuses. May use plain mucinex (guaifenesin) with plenty of fluid to help mobilize mucous. Cheratussin (codeine) cough medicine for night time.  Please let us know if fever >101.5, trouble opening/closing mouth, difficulty swallowing, or worsening instead of improving as expected.

## 2018-10-24 ENCOUNTER — Other Ambulatory Visit: Payer: Self-pay | Admitting: Family Medicine

## 2018-10-24 DIAGNOSIS — D509 Iron deficiency anemia, unspecified: Secondary | ICD-10-CM

## 2018-10-24 DIAGNOSIS — E039 Hypothyroidism, unspecified: Secondary | ICD-10-CM

## 2018-10-24 DIAGNOSIS — E559 Vitamin D deficiency, unspecified: Secondary | ICD-10-CM

## 2018-10-24 DIAGNOSIS — R739 Hyperglycemia, unspecified: Secondary | ICD-10-CM

## 2018-10-24 DIAGNOSIS — I10 Essential (primary) hypertension: Secondary | ICD-10-CM

## 2018-10-24 DIAGNOSIS — Z9884 Bariatric surgery status: Secondary | ICD-10-CM

## 2018-10-24 DIAGNOSIS — E519 Thiamine deficiency, unspecified: Secondary | ICD-10-CM

## 2018-10-25 ENCOUNTER — Other Ambulatory Visit: Payer: Self-pay

## 2018-10-25 ENCOUNTER — Other Ambulatory Visit (INDEPENDENT_AMBULATORY_CARE_PROVIDER_SITE_OTHER): Payer: BLUE CROSS/BLUE SHIELD

## 2018-10-25 DIAGNOSIS — E559 Vitamin D deficiency, unspecified: Secondary | ICD-10-CM

## 2018-10-25 DIAGNOSIS — D509 Iron deficiency anemia, unspecified: Secondary | ICD-10-CM

## 2018-10-25 DIAGNOSIS — E519 Thiamine deficiency, unspecified: Secondary | ICD-10-CM

## 2018-10-25 DIAGNOSIS — R739 Hyperglycemia, unspecified: Secondary | ICD-10-CM | POA: Diagnosis not present

## 2018-10-25 DIAGNOSIS — Z9884 Bariatric surgery status: Secondary | ICD-10-CM

## 2018-10-25 DIAGNOSIS — E039 Hypothyroidism, unspecified: Secondary | ICD-10-CM | POA: Diagnosis not present

## 2018-10-25 DIAGNOSIS — I10 Essential (primary) hypertension: Secondary | ICD-10-CM | POA: Diagnosis not present

## 2018-10-25 LAB — CBC WITH DIFFERENTIAL/PLATELET
Basophils Absolute: 0 10*3/uL (ref 0.0–0.1)
Basophils Relative: 0.5 % (ref 0.0–3.0)
Eosinophils Absolute: 0.1 10*3/uL (ref 0.0–0.7)
Eosinophils Relative: 2.7 % (ref 0.0–5.0)
HCT: 36.1 % (ref 36.0–46.0)
Hemoglobin: 11.9 g/dL — ABNORMAL LOW (ref 12.0–15.0)
Lymphocytes Relative: 34.4 % (ref 12.0–46.0)
Lymphs Abs: 1.4 10*3/uL (ref 0.7–4.0)
MCHC: 33.1 g/dL (ref 30.0–36.0)
MCV: 87.6 fl (ref 78.0–100.0)
MONOS PCT: 12.4 % — AB (ref 3.0–12.0)
Monocytes Absolute: 0.5 10*3/uL (ref 0.1–1.0)
Neutro Abs: 2 10*3/uL (ref 1.4–7.7)
Neutrophils Relative %: 50 % (ref 43.0–77.0)
Platelets: 291 10*3/uL (ref 150.0–400.0)
RBC: 4.12 Mil/uL (ref 3.87–5.11)
RDW: 16.2 % — ABNORMAL HIGH (ref 11.5–15.5)
WBC: 4 10*3/uL (ref 4.0–10.5)

## 2018-10-25 LAB — T4, FREE: Free T4: 0.67 ng/dL (ref 0.60–1.60)

## 2018-10-25 LAB — COMPREHENSIVE METABOLIC PANEL
ALT: 64 U/L — ABNORMAL HIGH (ref 0–35)
AST: 40 U/L — ABNORMAL HIGH (ref 0–37)
Albumin: 4 g/dL (ref 3.5–5.2)
Alkaline Phosphatase: 82 U/L (ref 39–117)
BUN: 11 mg/dL (ref 6–23)
CALCIUM: 9.3 mg/dL (ref 8.4–10.5)
CO2: 29 mEq/L (ref 19–32)
Chloride: 102 mEq/L (ref 96–112)
Creatinine, Ser: 0.69 mg/dL (ref 0.40–1.20)
GFR: 109.56 mL/min (ref >60.00)
Glucose, Bld: 99 mg/dL (ref 70–99)
Potassium: 4 mEq/L (ref 3.5–5.1)
Sodium: 139 mEq/L (ref 135–145)
Total Bilirubin: 0.3 mg/dL (ref 0.2–1.2)
Total Protein: 6.8 g/dL (ref 6.0–8.3)

## 2018-10-25 LAB — IBC + FERRITIN
FERRITIN: 13 ng/mL (ref 10.0–291.0)
Iron: 61 ug/dL (ref 42–145)
Saturation Ratios: 14.7 % — ABNORMAL LOW (ref 20.0–50.0)
TRANSFERRIN: 297 mg/dL (ref 212.0–360.0)

## 2018-10-25 LAB — LIPID PANEL
CHOLESTEROL: 196 mg/dL (ref 0–200)
HDL: 53.5 mg/dL (ref >39.00)
LDL Cholesterol: 118 mg/dL — ABNORMAL HIGH (ref 0–99)
NonHDL: 142.26
Total CHOL/HDL Ratio: 4
Triglycerides: 122 mg/dL (ref 0.0–149.0)
VLDL: 24.4 mg/dL (ref 0.0–40.0)

## 2018-10-25 LAB — MICROALBUMIN / CREATININE URINE RATIO
Creatinine,U: 95.6 mg/dL
Microalb Creat Ratio: 1.2 mg/g (ref 0.0–30.0)
Microalb, Ur: 1.1 mg/dL (ref 0.0–1.9)

## 2018-10-25 LAB — VITAMIN D 25 HYDROXY (VIT D DEFICIENCY, FRACTURES): VITD: 23.06 ng/mL — ABNORMAL LOW (ref 30.00–100.00)

## 2018-10-25 LAB — TSH: TSH: 0.73 u[IU]/mL (ref 0.35–4.50)

## 2018-10-25 LAB — HEMOGLOBIN A1C: Hgb A1c MFr Bld: 5.7 % (ref 4.6–6.5)

## 2018-10-27 ENCOUNTER — Other Ambulatory Visit: Payer: Self-pay | Admitting: Family Medicine

## 2018-10-29 LAB — VITAMIN B1: Vitamin B1 (Thiamine): 10 nmol/L (ref 8–30)

## 2018-10-30 ENCOUNTER — Encounter: Payer: BLUE CROSS/BLUE SHIELD | Admitting: Family Medicine

## 2018-12-29 ENCOUNTER — Other Ambulatory Visit: Payer: Self-pay | Admitting: Family Medicine

## 2019-01-28 ENCOUNTER — Encounter: Payer: Self-pay | Admitting: Family Medicine

## 2019-01-28 ENCOUNTER — Ambulatory Visit (INDEPENDENT_AMBULATORY_CARE_PROVIDER_SITE_OTHER): Payer: BC Managed Care – PPO | Admitting: Family Medicine

## 2019-01-28 VITALS — BP 130/100 | HR 91 | Temp 99.0°F | Ht 65.0 in | Wt 202.0 lb

## 2019-01-28 DIAGNOSIS — F418 Other specified anxiety disorders: Secondary | ICD-10-CM | POA: Diagnosis not present

## 2019-01-28 DIAGNOSIS — D509 Iron deficiency anemia, unspecified: Secondary | ICD-10-CM

## 2019-01-28 DIAGNOSIS — Z0001 Encounter for general adult medical examination with abnormal findings: Secondary | ICD-10-CM | POA: Diagnosis not present

## 2019-01-28 DIAGNOSIS — I10 Essential (primary) hypertension: Secondary | ICD-10-CM | POA: Diagnosis not present

## 2019-01-28 DIAGNOSIS — E519 Thiamine deficiency, unspecified: Secondary | ICD-10-CM

## 2019-01-28 DIAGNOSIS — E039 Hypothyroidism, unspecified: Secondary | ICD-10-CM | POA: Diagnosis not present

## 2019-01-28 DIAGNOSIS — Z9884 Bariatric surgery status: Secondary | ICD-10-CM

## 2019-01-28 DIAGNOSIS — E559 Vitamin D deficiency, unspecified: Secondary | ICD-10-CM

## 2019-01-28 DIAGNOSIS — Z1239 Encounter for other screening for malignant neoplasm of breast: Secondary | ICD-10-CM

## 2019-01-28 DIAGNOSIS — E669 Obesity, unspecified: Secondary | ICD-10-CM

## 2019-01-28 MED ORDER — VENLAFAXINE HCL 100 MG PO TABS: 100.0000 mg | ORAL_TABLET | Freq: Two times a day (BID) | ORAL | 3 refills | Status: DC

## 2019-01-28 NOTE — Assessment & Plan Note (Addendum)
Preventative protocols reviewed and updated unless pt declined. Discussed healthy diet and lifestyle. Mammogram ordered. She would like to establish for well woman exam here - will return for pap smear next month.

## 2019-01-28 NOTE — Assessment & Plan Note (Signed)
S/p sleeve gastrectomy, weight gain noted. She will renew efforts at regular exercise routine.

## 2019-01-28 NOTE — Assessment & Plan Note (Addendum)
Blood counts stable, iron levels low normal. Continues oral iron.

## 2019-01-28 NOTE — Assessment & Plan Note (Addendum)
Off meds. Chronic, deteriorated likely related to decreased exercise routine. Pt will renew efforts at regular aerobic exercise routine. Reassess at f/u visit next month.

## 2019-01-28 NOTE — Assessment & Plan Note (Signed)
Low normal s/p replacement. Continue B1 daily.

## 2019-01-28 NOTE — Assessment & Plan Note (Signed)
Levels low despite 2000 IU daily. Consider weekly supplementation.

## 2019-01-28 NOTE — Progress Notes (Signed)
Virtual visit completed through [facility]. Due to national recommendations of social distancing due to COVID-19, a virtual visit is felt to be most appropriate for this patient at this time. Reviewed limitations of a virtual visit. Difficulty with video due to poor connection.   Patient location: in her car Provider location: [place] [facility], office If any vitals were documented, they were collected by patient at home unless specified below.    BP (!) 130/100   Pulse 91   Temp 99 F (37.2 C)   Ht 5\' 5"  (1.651 m)   Wt 202 lb (91.6 kg)   SpO2 98%   BMI 33.61 kg/m    CC: CPE Subjective:    Patient ID: Diana Cox, female    DOB: 10/09/1970, 48 y.o.   MRN: [id number]  HPI: Diana Cox is a 48 y.o. female presenting on 01/28/2019 for Annual Exam   S/p lap gastric sleeve.  BP elevated today - attributes to decreased exercise.  Taking iron and vit d daily.   Preventative: No fmhx colon cancer. No bowel changes or blood in stool.  Breast cancer screening - normal mammogram 2013, overdue. Thinks had done at [facility].  Well woman exam - with [facility] Dr Nori Riis, overdue for f/u.  S/p BTL 2003  LMP 01/15/2019 Flu shot - today Tdap - 2012 Seat belt use discussed Sunscreen use discussed, no changing moles on skin.  Dentist q6 mo Eye exam yearly  Caffeine: 2 cans mountain dew/day (1 hour drive, works Medical sales representative, [place]) Lives with BF, 1 cat Occ: works in [employer] fund[employer], retired 01/2019 Edu: [employer] 07/2011 Act: walks, bikes - limited recently due to covid19 Diet: getting healthy, lots of water      Relevant past medical, surgical, family and social history reviewed and updated as indicated. Interim medical history since our last visit reviewed. Allergies and medications reviewed and updated. Outpatient Medications Prior to Visit  Medication Sig Dispense Refill  . Cholecalciferol (VITAMIN D) 2000 units CAPS Take 1 capsule (2,000 Units total) by mouth daily. 30 capsule   . ferrous  sulfate 325 (65 FE) MG tablet Take 1 tablet (325 mg total) by mouth daily with breakfast.    . levothyroxine (SYNTHROID) 88 MCG tablet TAKE 1 TABLET BY MOUTH EVERY DAY 30 tablet 1  . Multiple Vitamins-Iron (DAILY VITE MULTIVITAMIN/IRON) TABS Take 1 tablet by mouth daily.    Marland Kitchen thiamine (VITAMIN B-1) 50 MG tablet Take 1 tablet (50 mg total) by mouth daily.    Marland Kitchen venlafaxine (EFFEXOR) 100 MG tablet Take 1 tablet (100 mg total) by mouth 2 (two) times daily. 180 tablet 1  . amoxicillin (AMOXIL) 875 MG tablet Take 1 tablet (875 mg total) by mouth 2 (two) times daily. 20 tablet 0  . guaiFENesin-codeine (CHERATUSSIN AC) 100-10 MG/5ML syrup Take 5 mLs by mouth 2 (two) times daily as needed for cough (sedation precautions). 120 mL 0   No facility-administered medications prior to visit.      Per HPI unless specifically indicated in ROS section below Review of Systems  Constitutional: Negative for activity change, appetite change, chills, fatigue, fever and unexpected weight change.  HENT: Negative for hearing loss.   Eyes: Negative for visual disturbance.  Respiratory: Negative for cough, chest tightness, shortness of breath and wheezing.   Cardiovascular: Negative for chest pain, palpitations and leg swelling.  Gastrointestinal: Negative for abdominal distention, abdominal pain, blood in stool, constipation, diarrhea, nausea and vomiting.  Genitourinary: Negative for difficulty urinating and hematuria.  Musculoskeletal:  Negative for arthralgias, myalgias and neck pain.  Skin: Negative for rash.  Neurological: Negative for dizziness, seizures, syncope and headaches.  Hematological: Negative for adenopathy. Does not bruise/bleed easily.  Psychiatric/Behavioral: Negative for dysphoric mood. The patient is not nervous/anxious.    Objective:    BP (!) 130/100   Pulse 91   Temp 99 F (37.2 C)   Ht 5\' 5"  (1.651 m)   Wt 202 lb (91.6 kg)   SpO2 98%   BMI 33.61 kg/m   Wt Readings from Last 3  Encounters:  01/28/19 202 lb (91.6 kg)  10/22/18 204 lb 5 oz (92.7 kg)  08/01/18 202 lb 8 oz (91.9 kg)     Physical exam: Gen: alert, NAD, not ill appearing Pulm: speaks in complete sentences without increased work of breathing Psych: normal mood, normal thought content      Results for orders placed or performed in visit on 10/25/18  T4, free  Result Value Ref Range   Free T4 0.67 0.60 - 1.60 ng/dL  IBC + Ferritin  Result Value Ref Range   Iron 61 42 - 145 ug/dL   Transferrin 161.0297.0 960.4212.0 - 360.0 mg/dL   Saturation Ratios 54.014.7 (L) 20.0 - 50.0 %   Ferritin 13.0 10.0 - 291.0 ng/mL  Vitamin B1  Result Value Ref Range   Vitamin B1 (Thiamine) 10 8 - 30 nmol/L  VITAMIN D 25 Hydroxy (Vit-D Deficiency, Fractures)  Result Value Ref Range   VITD 23.06 (L) 30.00 - 100.00 ng/mL  Microalbumin / creatinine urine ratio  Result Value Ref Range   Microalb, Ur 1.1 0.0 - 1.9 mg/dL   Creatinine,U 98.195.6 mg/dL   Microalb Creat Ratio 1.2 0.0 - 30.0 mg/g  CBC with Differential/Platelet  Result Value Ref Range   WBC 4.0 4.0 - 10.5 K/uL   RBC 4.12 3.87 - 5.11 Mil/uL   Hemoglobin 11.9 (L) 12.0 - 15.0 g/dL   HCT 19.136.1 47.836.0 - 29.546.0 %   MCV 87.6 78.0 - 100.0 fl   MCHC 33.1 30.0 - 36.0 g/dL   RDW 62.116.2 (H) 30.811.5 - 65.715.5 %   Platelets 291.0 150.0 - 400.0 K/uL   Neutrophils Relative % 50.0 43.0 - 77.0 %   Lymphocytes Relative 34.4 12.0 - 46.0 %   Monocytes Relative 12.4 (H) 3.0 - 12.0 %   Eosinophils Relative 2.7 0.0 - 5.0 %   Basophils Relative 0.5 0.0 - 3.0 %   Neutro Abs 2.0 1.4 - 7.7 K/uL   Lymphs Abs 1.4 0.7 - 4.0 K/uL   Monocytes Absolute 0.5 0.1 - 1.0 K/uL   Eosinophils Absolute 0.1 0.0 - 0.7 K/uL   Basophils Absolute 0.0 0.0 - 0.1 K/uL  Hemoglobin A1c  Result Value Ref Range   Hgb A1c MFr Bld 5.7 4.6 - 6.5 %  TSH  Result Value Ref Range   TSH 0.73 0.35 - 4.50 uIU/mL  Comprehensive metabolic panel  Result Value Ref Range   Sodium 139 135 - 145 mEq/L   Potassium 4.0 3.5 - 5.1 mEq/L    Chloride 102 96 - 112 mEq/L   CO2 29 19 - 32 mEq/L   Glucose, Bld 99 70 - 99 mg/dL   BUN 11 6 - 23 mg/dL   Creatinine, Ser 8.460.69 0.40 - 1.20 mg/dL   Total Bilirubin 0.3 0.2 - 1.2 mg/dL   Alkaline Phosphatase 82 39 - 117 U/L   AST 40 (H) 0 - 37 U/L   ALT 64 (H) 0 - 35 U/L  Total Protein 6.8 6.0 - 8.3 g/dL   Albumin 4.0 3.5 - 5.2 g/dL   Calcium 9.3 8.4 - 16.110.5 mg/dL   GFR 096.04109.56 >54.09>60.00 mL/min  Lipid panel  Result Value Ref Range   Cholesterol 196 0 - 200 mg/dL   Triglycerides 811.9122.0 0.0 - 149.0 mg/dL   HDL 14.7853.50 >29.56>39.00 mg/dL   VLDL 21.324.4 0.0 - 08.640.0 mg/dL   LDL Cholesterol 578118 (H) 0 - 99 mg/dL   Total CHOL/HDL Ratio 4    NonHDL 142.26    Dep[facility] 01/28/2019 01/28/2019 11/14/2017  Decreased Interest 0 0 1  Down, Depressed, Hopeless 0 0 2  PHQ - 2 Score 0 0 3  Altered sleeping 0 - 0  Tired, decreased energy 0 - 2  Change in appetite 0 - 2  Feeling bad or failure about yourself  1 - 0  Trouble concentrating 0 - 3  Moving slowly or fidgety/restless 0 - 0  Suicidal thoughts 0 - 0  PHQ-9 Score 1 - 10    Assessment & Plan:   Problem List Items Addressed This Visit    Vitamin D deficiency    Levels low despite 2000 IU daily. Consider weekly supplementation.      Vitamin B1 deficiency    Low normal s/p replacement. Continue B1 daily.       Obesity, Class I, BMI 30-34.9    S/p sleeve gastrectomy, weight gain noted. She will renew efforts at regular exercise routine.       IDA (iron deficiency anemia)    Blood counts stable, iron levels low normal. Continues oral iron.       Hypothyroid    Chronic, stable on current levothyroxine dose.       HTN (hypertension)    Off meds. Chronic, deteriorated likely related to decreased exercise routine. Pt will renew efforts at regular aerobic exercise routine. Reassess at f/u visit next month.       Health care maintenance - Primary    Preventative protocols reviewed and updated unless pt declined. Discussed healthy  diet and lifestyle. Mammogram ordered. She would like to establish for well woman exam here - will return for pap smear next month.       Depression with anxiety    Chronic, stable on current regimen of effexor BID. Continue. PHQ9, GAD7 reviewed.       Relevant Medications   venlafaxine (EFFEXOR) 100 MG tablet   Bariatric surgery status    Other Visit Diagnoses    Breast cancer screening       Relevant Orders   MM 3D SCREEN BREAST BILATERAL       Meds ordered this encounter  Medications  . venlafaxine (EFFEXOR) 100 MG tablet    Sig: Take 1 tablet (100 mg total) by mouth 2 (two) times daily.    Dispense:  180 tablet    Refill:  3   Orders Placed This Encounter  Procedures  . MM 3D SCREEN BREAST BILATERAL    Standing Status:   Future    Standing Expiration Date:   03/29/2020    Order Specific Question:   Reason for Exam (SYMPTOM  OR DIAGNOSIS REQUIRED)    Answer:   breast cancer screening    Order Specific Question:   Is the patient pregnant?    Answer:   No    Order Specific Question:   Preferred imaging location?    Answer:   [facility]    I discussed the assessment and treatment  plan with the patient. The patient was provided an opportunity to ask questions and all were answered. The patient agreed with the plan and demonstrated an understanding of the instructions. The patient was advised to call back or seek an in-person evaluation if the symptoms worsen or if the condition fails to improve as anticipated.  Follow up plan: No follow-ups on file.  Eustaquio BoydenJavier Dhyan Noah, MD

## 2019-01-28 NOTE — Assessment & Plan Note (Signed)
Chronic, stable on current levothyroxine dose  

## 2019-01-28 NOTE — Assessment & Plan Note (Signed)
Chronic, stable on current regimen of effexor BID. Continue. PHQ9, GAD7 reviewed.

## 2019-03-03 ENCOUNTER — Ambulatory Visit: Payer: BC Managed Care – PPO | Admitting: Family Medicine

## 2019-03-13 ENCOUNTER — Other Ambulatory Visit: Payer: Self-pay | Admitting: Family Medicine

## 2019-03-17 ENCOUNTER — Ambulatory Visit: Payer: BC Managed Care – PPO | Admitting: Family Medicine

## 2019-03-17 ENCOUNTER — Encounter: Payer: Self-pay | Admitting: Family Medicine

## 2019-03-17 ENCOUNTER — Other Ambulatory Visit: Payer: Self-pay

## 2019-03-17 ENCOUNTER — Other Ambulatory Visit (HOSPITAL_COMMUNITY)
Admission: RE | Admit: 2019-03-17 | Discharge: 2019-03-17 | Disposition: A | Discharge status: Home / Self Care | Payer: BC Managed Care – PPO | Source: Ambulatory Visit | Attending: Family Medicine | Admitting: Family Medicine

## 2019-03-17 VITALS — BP 132/84 | HR 104 | Temp 98.0°F | Ht 65.0 in | Wt 206.1 lb

## 2019-03-17 DIAGNOSIS — N841 Polyp of cervix uteri: Secondary | ICD-10-CM

## 2019-03-17 DIAGNOSIS — R87619 Unspecified abnormal cytological findings in specimens from cervix uteri: Secondary | ICD-10-CM | POA: Insufficient documentation

## 2019-03-17 DIAGNOSIS — Z01419 Encounter for gynecological examination (general) (routine) without abnormal findings: Secondary | ICD-10-CM | POA: Diagnosis not present

## 2019-03-17 DIAGNOSIS — I10 Essential (primary) hypertension: Secondary | ICD-10-CM | POA: Diagnosis not present

## 2019-03-17 HISTORY — DX: Polyp of cervix uteri: N84.1

## 2019-03-17 NOTE — Assessment & Plan Note (Signed)
BP improved today. Remains off meds.

## 2019-03-17 NOTE — Assessment & Plan Note (Signed)
Pelvic/pap and breast exam performed. I asked her to check up front about scheduling mammogram.

## 2019-03-17 NOTE — Patient Instructions (Addendum)
Blood pressures are looking better! Check up front about scheduling mammogram - ordered 01/2019.  Pelvic exam/pap smear today - we will be in touch with results. Good to see you!

## 2019-03-17 NOTE — Progress Notes (Signed)
This visit was conducted in person.  BP 132/84 (BP Location: Left Arm, Patient Position: Sitting, Cuff Size: Normal)   Pulse (!) 104   Temp 98 F (36.7 C) (Temporal)   Ht 5\' 5"  (1.651 m)   Wt 206 lb 2 oz (93.5 kg)   LMP 03/02/2019   SpO2 99%   BMI 34.30 kg/m    CC: GYN exam Subjective:    Patient ID: Anglea M Lorence, female    DOB: 1971-05-20, 48 y.o.   MRN: [id number]  HPI: Layza M Verhagen is a 48 y.o. female presenting on 03/17/2019 for Gynecologic Exam (Had CPE 01/28/19.)   Virtual physical 01/28/2019 - at that time wanted to establish here for GYN. Here for well woman exam today. BP better controlled today. Back working out regularly.  Has not received call to schedule mammogram yet.  LMP 03/03/2019, normal regular periods Q monthly.      Relevant past medical, surgical, family and social history reviewed and updated as indicated. Interim medical history since our last visit reviewed. Allergies and medications reviewed and updated. Outpatient Medications Prior to Visit  Medication Sig Dispense Refill  . Cholecalciferol (VITAMIN D) 2000 units CAPS Take 1 capsule (2,000 Units total) by mouth daily. 30 capsule   . ferrous sulfate 325 (65 FE) MG tablet Take 1 tablet (325 mg total) by mouth daily with breakfast.    . levothyroxine (SYNTHROID) 88 MCG tablet TAKE 1 TABLET BY MOUTH EVERY DAY 30 tablet 4  . Multiple Vitamins-Iron (DAILY VITE MULTIVITAMIN/IRON) TABS Take 1 tablet by mouth daily.    Marland Kitchen thiamine (VITAMIN B-1) 50 MG tablet Take 1 tablet (50 mg total) by mouth daily.    Marland Kitchen venlafaxine (EFFEXOR) 100 MG tablet Take 1 tablet (100 mg total) by mouth 2 (two) times daily. 180 tablet 3   No facility-administered medications prior to visit.      Per HPI unless specifically indicated in ROS section below Review of Systems Objective:    BP 132/84 (BP Location: Left Arm, Patient Position: Sitting, Cuff Size: Normal)   Pulse (!) 104   Temp 98 F (36.7 C) (Temporal)   Ht  5\' 5"  (1.651 m)   Wt 206 lb 2 oz (93.5 kg)   LMP 03/02/2019   SpO2 99%   BMI 34.30 kg/m   Wt Readings from Last 3 Encounters:  03/17/19 206 lb 2 oz (93.5 kg)  01/28/19 202 lb (91.6 kg)  10/22/18 204 lb 5 oz (92.7 kg)    Physical Exam Vitals signs and nursing note reviewed. Exam conducted with a chaperone present.  Constitutional:      General: She is not in acute distress.    Appearance: Normal appearance. She is not ill-appearing.  Chest:     Breasts:        Right: Normal. No swelling, bleeding, inverted nipple, mass or nipple discharge.        Left: Normal. No swelling, bleeding, inverted nipple, mass or nipple discharge.  Genitourinary:    General: Normal vulva.     Exam position: Supine.     Labia:        Right: No rash or tenderness.        Left: No rash or tenderness.      Vagina: Normal.     Cervix: Lesion (polyp) present.     Uterus: Normal.      Adnexa: Right adnexa normal and left adnexa normal.     Comments: Pap performed on cervix Lymphadenopathy:  Upper Body:     Right upper body: No supraclavicular or axillary adenopathy.     Left upper body: No supraclavicular or axillary adenopathy.  Neurological:     Mental Status: She is alert.       Assessment & Plan:   Problem List Items Addressed This Visit    Visit for routine gyn exam - Primary    Pelvic/pap and breast exam performed. I asked her to check up front about scheduling mammogram.       Relevant Orders   Cytology - PAP   Polyp of cervix    Pt endorses h/o this in the past, had not had it removed. Given asxs, not associated with bleed, will just monitor for now.       HTN (hypertension)    BP improved today. Remains off meds.           No orders of the defined types were placed in this encounter.  No orders of the defined types were placed in this encounter.   Patient Instructions  Blood pressures are looking better! Check up front about scheduling mammogram - ordered 01/2019.   Pelvic exam/pap smear today - we will be in touch with results. Good to see you!   Follow up plan: No follow-ups on file.  Eustaquio BoydenJavier Dayla Gasca, MD

## 2019-03-17 NOTE — Assessment & Plan Note (Addendum)
Pt endorses h/o this in the past, had not had it removed. Given asxs, not associated with bleed, will just monitor for now.

## 2019-03-19 LAB — CYTOLOGY - PAP
Diagnosis: UNDETERMINED — AB
HPV 16/18/45 genotyping: POSITIVE — AB
HPV: DETECTED — AB

## 2019-03-20 ENCOUNTER — Other Ambulatory Visit: Payer: Self-pay | Admitting: Family Medicine

## 2019-03-20 DIAGNOSIS — R87810 Cervical high risk human papillomavirus (HPV) DNA test positive: Secondary | ICD-10-CM

## 2019-03-20 DIAGNOSIS — R87610 Atypical squamous cells of undetermined significance on cytologic smear of cervix (ASC-US): Secondary | ICD-10-CM

## 2019-03-20 DIAGNOSIS — N841 Polyp of cervix uteri: Secondary | ICD-10-CM

## 2019-03-28 ENCOUNTER — Encounter: Payer: Self-pay | Admitting: Family Medicine

## 2019-04-01 ENCOUNTER — Other Ambulatory Visit: Payer: Self-pay

## 2019-04-02 ENCOUNTER — Ambulatory Visit: Payer: BC Managed Care – PPO | Admitting: Gynecology

## 2019-04-02 ENCOUNTER — Encounter: Payer: Self-pay | Admitting: Gynecology

## 2019-04-02 VITALS — BP 138/90 | Ht 66.0 in | Wt 204.0 lb

## 2019-04-02 DIAGNOSIS — R87811 Vaginal high risk human papillomavirus (HPV) DNA test positive: Secondary | ICD-10-CM | POA: Diagnosis not present

## 2019-04-02 DIAGNOSIS — N841 Polyp of cervix uteri: Secondary | ICD-10-CM | POA: Diagnosis not present

## 2019-04-02 DIAGNOSIS — R87620 Atypical squamous cells of undetermined significance on cytologic smear of vagina (ASC-US): Secondary | ICD-10-CM | POA: Diagnosis not present

## 2019-04-02 NOTE — Patient Instructions (Signed)
Office will call you with biopsy results 

## 2019-04-02 NOTE — Progress Notes (Signed)
    Diana Cox [birth year] [id number]        48 y.o.  G1P1 new patient referred from Dr. Danise Mina due to Pap smear showing ASCUS positive high risk HPV subtype 18/45.  She relates a history of abnormal Pap smears a number of years ago but they have been normal most recently.  Having regular monthly menses.  Tubal sterilization.  Past medical history,surgical history, problem list, medications, allergies, family history and social history were all reviewed and documented in the EPIC chart.  Directed ROS with pertinent positives and negatives documented in the history of present illness/assessment and plan.  Exam: Caryn Bee assistant Vitals:   04/02/19 1107  BP: 138/90  Weight: 204 lb (92.5 kg)  Height: 5\' 6"  (1.676 m)   General appearance:  Normal Abdomen soft nontender without masses guarding rebound Pelvic external BUS vagina normal.  Cervix with polyp extruding from external office.  Uterus grossly normal size midline mobile nontender.  Adnexa without masses or tenderness.  Colposcopy performed after acetic acid cleanse was adequate noting transformation zone visualized 360 degrees.  Endocervical polyp on stalk extruding from external office.  Biopsied off in its entirety.  Mild inflammatory changes 12:00 transformation zone biopsied.  ECC performed.  Patient tolerated well.  No acetowhite changes, punctations, mosaicism or atypical vessels noted.  Physical Exam  Genitourinary:       Assessment/Plan:  48 y.o. G1P1 with first abnormal Pap smear in years showing ASCUS positive high risk HPV subtype 18/45.  Colposcopy shows endocervical polyp which was biopsied off.  Mild inflammatory changes at 12:00 transformation zone biopsied.  ECC performed.  We discussed dysplasia, high-grade/low-grade, progression/regression and the positive HPV association.  If biopsies all benign/low-grade then plan expectant management with follow-up Pap smear/HPV in 1 year.  If biopsies otherwise then  discussed possible further evaluation/treatment.  Anastasio Auerbach MD, 11:52 AM 04/02/2019

## 2019-04-04 ENCOUNTER — Encounter: Payer: Self-pay | Admitting: Gynecology

## 2019-04-04 LAB — TISSUE PATH REPORT 10802

## 2019-04-04 LAB — PATHOLOGY REPORT

## 2019-04-30 ENCOUNTER — Encounter: Payer: Self-pay | Admitting: Gynecology

## 2019-05-06 ENCOUNTER — Ambulatory Visit
Admission: RE | Admit: 2019-05-06 | Discharge: 2019-05-06 | Disposition: A | Discharge status: Home / Self Care | Payer: BC Managed Care – PPO | Source: Ambulatory Visit | Attending: Family Medicine | Admitting: Family Medicine

## 2019-05-06 DIAGNOSIS — Z1231 Encounter for screening mammogram for malignant neoplasm of breast: Secondary | ICD-10-CM | POA: Diagnosis not present

## 2019-05-06 DIAGNOSIS — Z1239 Encounter for other screening for malignant neoplasm of breast: Secondary | ICD-10-CM | POA: Diagnosis not present

## 2019-05-07 ENCOUNTER — Encounter: Payer: Self-pay | Admitting: Family Medicine

## 2019-05-07 LAB — HM MAMMOGRAPHY

## 2020-02-18 ENCOUNTER — Other Ambulatory Visit: Payer: Self-pay | Admitting: Family Medicine

## 2020-02-18 NOTE — Telephone Encounter (Signed)
E-scribed refill.  Plz schedule cpe and lab visits.  

## 2020-03-01 ENCOUNTER — Encounter: Payer: Self-pay | Admitting: Family Medicine

## 2020-03-03 ENCOUNTER — Other Ambulatory Visit: Payer: Self-pay | Admitting: Family Medicine

## 2020-03-04 MED ORDER — VENLAFAXINE HCL 100 MG PO TABS: 100.0000 mg | ORAL_TABLET | Freq: Two times a day (BID) | ORAL | 0 refills | Status: DC

## 2020-03-04 NOTE — Telephone Encounter (Signed)
Will refill 3 mo supply x1. Pt will need CPE for more refills. Thanks.

## 2020-04-08 NOTE — Telephone Encounter (Signed)
Patient is scheduled for cpe and labs

## 2020-04-09 NOTE — Telephone Encounter (Signed)
Refill left on vm at pharmacy.  

## 2020-04-09 NOTE — Telephone Encounter (Signed)
Please phone in due to E prescribing error.  

## 2020-04-20 ENCOUNTER — Other Ambulatory Visit: Payer: Self-pay | Admitting: Family Medicine

## 2020-04-20 DIAGNOSIS — D509 Iron deficiency anemia, unspecified: Secondary | ICD-10-CM

## 2020-04-20 DIAGNOSIS — R739 Hyperglycemia, unspecified: Secondary | ICD-10-CM

## 2020-04-20 DIAGNOSIS — E559 Vitamin D deficiency, unspecified: Secondary | ICD-10-CM

## 2020-04-20 DIAGNOSIS — E519 Thiamine deficiency, unspecified: Secondary | ICD-10-CM

## 2020-04-20 DIAGNOSIS — I10 Essential (primary) hypertension: Secondary | ICD-10-CM

## 2020-04-20 DIAGNOSIS — E039 Hypothyroidism, unspecified: Secondary | ICD-10-CM

## 2020-04-20 DIAGNOSIS — Z9884 Bariatric surgery status: Secondary | ICD-10-CM

## 2020-04-21 ENCOUNTER — Other Ambulatory Visit (INDEPENDENT_AMBULATORY_CARE_PROVIDER_SITE_OTHER): Payer: Self-pay

## 2020-04-21 DIAGNOSIS — E039 Hypothyroidism, unspecified: Secondary | ICD-10-CM

## 2020-04-21 DIAGNOSIS — E519 Thiamine deficiency, unspecified: Secondary | ICD-10-CM

## 2020-04-21 DIAGNOSIS — I10 Essential (primary) hypertension: Secondary | ICD-10-CM

## 2020-04-21 DIAGNOSIS — E559 Vitamin D deficiency, unspecified: Secondary | ICD-10-CM

## 2020-04-21 DIAGNOSIS — Z9884 Bariatric surgery status: Secondary | ICD-10-CM

## 2020-04-21 DIAGNOSIS — D509 Iron deficiency anemia, unspecified: Secondary | ICD-10-CM

## 2020-04-21 DIAGNOSIS — R739 Hyperglycemia, unspecified: Secondary | ICD-10-CM

## 2020-04-21 LAB — CBC WITH DIFFERENTIAL/PLATELET
Basophils Absolute: 0 10*3/uL (ref 0.0–0.1)
Basophils Relative: 0.9 % (ref 0.0–3.0)
Eosinophils Absolute: 0.1 10*3/uL (ref 0.0–0.7)
Eosinophils Relative: 2.2 % (ref 0.0–5.0)
HCT: 33.5 % — ABNORMAL LOW (ref 36.0–46.0)
Hemoglobin: 10.6 g/dL — ABNORMAL LOW (ref 12.0–15.0)
Lymphocytes Relative: 40.4 % (ref 12.0–46.0)
Lymphs Abs: 1.3 10*3/uL (ref 0.7–4.0)
MCHC: 31.6 g/dL (ref 30.0–36.0)
MCV: 81.5 fl (ref 78.0–100.0)
Monocytes Absolute: 0.3 10*3/uL (ref 0.1–1.0)
Monocytes Relative: 9.6 % (ref 3.0–12.0)
Neutro Abs: 1.5 10*3/uL (ref 1.4–7.7)
Neutrophils Relative %: 46.9 % (ref 43.0–77.0)
Platelets: 350 10*3/uL (ref 150.0–400.0)
RBC: 4.11 Mil/uL (ref 3.87–5.11)
RDW: 16.5 % — ABNORMAL HIGH (ref 11.5–15.5)
WBC: 3.1 10*3/uL — ABNORMAL LOW (ref 4.0–10.5)

## 2020-04-21 LAB — LIPID PANEL
Cholesterol: 222 mg/dL — ABNORMAL HIGH (ref 0–200)
HDL: 58.1 mg/dL (ref >39.00)
LDL Cholesterol: 135 mg/dL — ABNORMAL HIGH (ref 0–99)
NonHDL: 164.34
Total CHOL/HDL Ratio: 4
Triglycerides: 145 mg/dL (ref 0.0–149.0)
VLDL: 29 mg/dL (ref 0.0–40.0)

## 2020-04-21 LAB — VITAMIN D 25 HYDROXY (VIT D DEFICIENCY, FRACTURES): VITD: 13.29 ng/mL — ABNORMAL LOW (ref 30.00–100.00)

## 2020-04-21 LAB — COMPREHENSIVE METABOLIC PANEL
ALT: 12 U/L (ref 0–35)
AST: 18 U/L (ref 0–37)
Albumin: 4.1 g/dL (ref 3.5–5.2)
Alkaline Phosphatase: 75 U/L (ref 39–117)
BUN: 12 mg/dL (ref 6–23)
CO2: 30 mEq/L (ref 19–32)
Calcium: 9 mg/dL (ref 8.4–10.5)
Chloride: 101 mEq/L (ref 96–112)
Creatinine, Ser: 0.76 mg/dL (ref 0.40–1.20)
GFR: 97.4 mL/min (ref >60.00)
Glucose, Bld: 86 mg/dL (ref 70–99)
Potassium: 4.5 mEq/L (ref 3.5–5.1)
Sodium: 136 mEq/L (ref 135–145)
Total Bilirubin: 0.4 mg/dL (ref 0.2–1.2)
Total Protein: 7.2 g/dL (ref 6.0–8.3)

## 2020-04-21 LAB — TSH: TSH: 1.62 u[IU]/mL (ref 0.35–4.50)

## 2020-04-21 LAB — IBC PANEL
Iron: 37 ug/dL — ABNORMAL LOW (ref 42–145)
Saturation Ratios: 7.1 % — ABNORMAL LOW (ref 20.0–50.0)
Transferrin: 370 mg/dL — ABNORMAL HIGH (ref 212.0–360.0)

## 2020-04-21 LAB — MICROALBUMIN / CREATININE URINE RATIO
Creatinine,U: 196.1 mg/dL
Microalb Creat Ratio: 0.9 mg/g (ref 0.0–30.0)
Microalb, Ur: 1.7 mg/dL (ref 0.0–1.9)

## 2020-04-21 LAB — FERRITIN: Ferritin: 4.3 ng/mL — ABNORMAL LOW (ref 10.0–291.0)

## 2020-04-21 LAB — VITAMIN B12: Vitamin B-12: 1050 pg/mL — ABNORMAL HIGH (ref 211–911)

## 2020-04-21 LAB — HEMOGLOBIN A1C: Hgb A1c MFr Bld: 6.3 % (ref 4.6–6.5)

## 2020-04-21 LAB — FOLATE: Folate: 7.6 ng/mL (ref >5.9)

## 2020-04-25 LAB — VITAMIN B1: Vitamin B1 (Thiamine): 8 nmol/L (ref 8–30)

## 2020-04-28 ENCOUNTER — Encounter: Payer: Self-pay | Admitting: Family Medicine

## 2020-04-28 ENCOUNTER — Ambulatory Visit (INDEPENDENT_AMBULATORY_CARE_PROVIDER_SITE_OTHER): Payer: 59 | Admitting: Family Medicine

## 2020-04-28 ENCOUNTER — Other Ambulatory Visit (HOSPITAL_COMMUNITY)
Admission: RE | Admit: 2020-04-28 | Discharge: 2020-04-28 | Disposition: A | Discharge status: Home / Self Care | Payer: 59 | Source: Ambulatory Visit | Attending: Family Medicine | Admitting: Family Medicine

## 2020-04-28 ENCOUNTER — Other Ambulatory Visit: Payer: Self-pay

## 2020-04-28 VITALS — BP 124/80 | HR 108 | Temp 98.1°F | Ht 65.25 in | Wt 212.1 lb

## 2020-04-28 DIAGNOSIS — Z01419 Encounter for gynecological examination (general) (routine) without abnormal findings: Secondary | ICD-10-CM | POA: Insufficient documentation

## 2020-04-28 DIAGNOSIS — N841 Polyp of cervix uteri: Secondary | ICD-10-CM

## 2020-04-28 DIAGNOSIS — I10 Essential (primary) hypertension: Secondary | ICD-10-CM

## 2020-04-28 DIAGNOSIS — E669 Obesity, unspecified: Secondary | ICD-10-CM

## 2020-04-28 DIAGNOSIS — F418 Other specified anxiety disorders: Secondary | ICD-10-CM

## 2020-04-28 DIAGNOSIS — G4733 Obstructive sleep apnea (adult) (pediatric): Secondary | ICD-10-CM

## 2020-04-28 DIAGNOSIS — Z23 Encounter for immunization: Secondary | ICD-10-CM

## 2020-04-28 DIAGNOSIS — E519 Thiamine deficiency, unspecified: Secondary | ICD-10-CM

## 2020-04-28 DIAGNOSIS — Z0001 Encounter for general adult medical examination with abnormal findings: Secondary | ICD-10-CM

## 2020-04-28 DIAGNOSIS — D509 Iron deficiency anemia, unspecified: Secondary | ICD-10-CM

## 2020-04-28 DIAGNOSIS — Z1211 Encounter for screening for malignant neoplasm of colon: Secondary | ICD-10-CM | POA: Diagnosis not present

## 2020-04-28 DIAGNOSIS — E559 Vitamin D deficiency, unspecified: Secondary | ICD-10-CM

## 2020-04-28 DIAGNOSIS — R87610 Atypical squamous cells of undetermined significance on cytologic smear of cervix (ASC-US): Secondary | ICD-10-CM

## 2020-04-28 DIAGNOSIS — R519 Headache, unspecified: Secondary | ICD-10-CM

## 2020-04-28 DIAGNOSIS — E039 Hypothyroidism, unspecified: Secondary | ICD-10-CM

## 2020-04-28 DIAGNOSIS — Z9884 Bariatric surgery status: Secondary | ICD-10-CM

## 2020-04-28 DIAGNOSIS — E66811 Obesity, class 1: Secondary | ICD-10-CM

## 2020-04-28 MED ORDER — MULTIVITAMIN ADULT PO TABS: 1.0000 | ORAL_TABLET | Freq: Every day | ORAL | Status: AC

## 2020-04-28 MED ORDER — VITAMIN D3 1.25 MG (50000 UT) PO TABS: 1.0000 | ORAL_TABLET | ORAL | 3 refills | Status: DC

## 2020-04-28 MED ORDER — VITAMIN B-1 50 MG PO TABS: 50.0000 mg | ORAL_TABLET | Freq: Every day | ORAL | Status: AC

## 2020-04-28 MED ORDER — VENLAFAXINE HCL 100 MG PO TABS
100.0000 mg | ORAL_TABLET | Freq: Two times a day (BID) | ORAL | 3 refills | Status: DC
Start: 2020-04-28 — End: 2021-05-13

## 2020-04-28 MED ORDER — LEVOTHYROXINE SODIUM 88 MCG PO TABS
88.0000 ug | ORAL_TABLET | Freq: Every day | ORAL | 3 refills | Status: DC
Start: 2020-04-28 — End: 2021-05-13

## 2020-04-28 NOTE — Assessment & Plan Note (Addendum)
Preventative protocols reviewed and updated unless pt declined. Discussed healthy diet and lifestyle.  Pap performed

## 2020-04-28 NOTE — Progress Notes (Addendum)
This visit was conducted in person.  BP 124/80 (BP Location: Left Arm, Patient Position: Sitting, Cuff Size: Large)   Pulse (!) 108   Temp 98.1 F (36.7 C) (Temporal)   Ht 5' 5.25" (1.657 m)   Wt 212 lb 1 oz (96.2 kg)   LMP 04/14/2020   SpO2 99%   BMI 35.02 kg/m    CC: CPE Subjective:    Patient ID: Diana Cox, female    DOB: Sep 13, 1970, 49 y.o.   MRN: [id number]  HPI: Diana Cox is a 49 y.o. female presenting on 04/28/2020 for Annual Exam   Mild tachycardia last 2 visits - doesn't drink much water. 1 cup coffee/day.   S/p lap gastric sleeve.  Currently taking biotin, iron 374m daily. Not taking b1 or vit D. Continues her levothyroxine and venlafaxine.   Increasing headaches - waking up with them but headache doesn't wake her up. Non restorative sleeping, attributes to partner's sleep habits - he likes to sleep with TV on. Some snoring, PNDyspnea. No witnessed apnea. She has CPAP but doesn't use.   Preventative: Colon cancer screening - dicsussed - would like to start with iFOB.  [facility] - pap with ASCUS HR HPV positive 03/2019, cervical polyp removed by GYN - s/p colposcopy 03/2019 - minimal atypia rec rpt 1 yr.  S/p BTL 2003  LMP 04/14/2020 - monthly and regular, heavy  [place] Tdap - 2012 Shingrix - discussed  Seat belt use discussed Sunscreen use discussed, no changing moles on skin.  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 Interim medical history since our last visit  reviewed. Allergies and medications reviewed and updated. Outpatient Medications Prior to Visit  Medication Sig Dispense Refill  . ferrous sulfate 325 (65 FE) MG tablet Take 1 tablet (325 mg total) by mouth daily with breakfast.    . levothyroxine (SYNTHROID) 88 MCG tablet TAKE 1 TABLET BY MOUTH EVERY DAY 30 tablet 4  . venlafaxine (EFFEXOR) 100 MG tablet Take 1 tablet (100 mg total) by mouth 2 (two) times daily. 180 tablet 0  . Cholecalciferol (VITAMIN D) 2000 units CAPS Take 1 capsule (2,000 Units total) by mouth daily. (Patient not taking: Reported on 04/28/2020) 30 capsule   . thiamine (VITAMIN B-1) 50 MG tablet Take 1 tablet (50 mg total) by mouth daily.     No facility-administered medications prior to visit.     Per HPI unless specifically indicated in ROS section below Review of Systems  Constitutional: Negative for activity change, appetite change, chills, fatigue, fever and unexpected weight change.  HENT: Negative for hearing loss.   Eyes: Negative for visual disturbance.  Respiratory: Negative for cough, chest tightness, shortness of breath and wheezing.   Cardiovascular: Negative for chest pain, palpitations and leg swelling.  Gastrointestinal: Negative for abdominal distention, abdominal pain, blood in stool, constipation, diarrhea, nausea and vomiting.  Genitourinary: Negative for difficulty urinating and hematuria.  Musculoskeletal: Negative for arthralgias, myalgias and neck pain.  Skin: Negative for rash.  Neurological: Positive for  headaches (increased frequency). Negative for dizziness, seizures and syncope.  Hematological: Negative for adenopathy. Does not bruise/bleed easily.  Psychiatric/Behavioral: Negative for dysphoric mood. The patient is not nervous/anxious.    Objective:  BP 124/80 (BP Location: Left Arm, Patient Position: Sitting, Cuff Size: Large)   Pulse (!) 108   Temp 98.1 F (36.7 C) (Temporal)   Ht 5' 5.25" (1.657 m)   Wt 212 lb 1 oz (96.2 kg)    LMP 04/14/2020   SpO2 99%   BMI 35.02 kg/m   Wt Readings from Last 3 Encounters:  04/28/20 212 lb 1 oz (96.2 kg)  04/02/19 204 lb (92.5 kg)  03/17/19 206 lb 2 oz (93.5 kg)      Physical Exam Vitals and nursing note reviewed. Exam conducted with a chaperone present.  Constitutional:      General: She is not in acute distress.    Appearance: Normal appearance. She is well-developed. She is not ill-appearing.  HENT:     Head: Normocephalic and atraumatic.     Right Ear: Hearing, tympanic membrane, ear canal and external ear normal.     Left Ear: Hearing, tympanic membrane, ear canal and external ear normal.     Mouth/Throat:     Pharynx: Uvula midline.  Eyes:     General: No scleral icterus.    Extraocular Movements: Extraocular movements intact.     Conjunctiva/sclera: Conjunctivae normal.     Pupils: Pupils are equal, round, and reactive to light.  Cardiovascular:     Rate and Rhythm: Regular rhythm. Tachycardia present.     Pulses: Normal pulses.          Radial pulses are 2+ on the right side and 2+ on the left side.     Heart sounds: Normal heart sounds. No murmur heard.      Comments: mild Pulmonary:     Effort: Pulmonary effort is normal. No respiratory distress.     Breath sounds: Normal breath sounds. No wheezing, rhonchi or rales.  Abdominal:     General: Abdomen is flat. Bowel sounds are normal. There is no distension.     Palpations: Abdomen is soft. There is no mass.     Tenderness: There is no abdominal tenderness. There is no guarding or rebound.     Hernia: No hernia is present.  Genitourinary:    General: Normal vulva.     Exam position: Supine.     Labia:        Right: No rash, tenderness or lesion.        Left: No rash, tenderness or lesion.      Vagina: Normal.     Cervix: Erythema present. No cervical motion tenderness, discharge or friability.     Uterus: Normal.      Comments: Pap performed on cervix Erythematous cervix noted Musculoskeletal:         General: Normal range of motion.     Cervical back: Normal range of motion and neck supple.     Right lower leg: No edema.     Left lower leg: No edema.  Lymphadenopathy:     Cervical: No cervical adenopathy.  Skin:    General: Skin is warm and dry.     Findings: No rash.  Neurological:     General: No focal deficit present.     Mental Status: She is alert and oriented to person, place, and time.     Comments: CN grossly intact, station and gait intact  Psychiatric:  Mood and Affect: Mood normal.        Behavior: Behavior normal.        Thought Content: Thought content normal.        Judgment: Judgment normal.       Results for orders placed or performed in visit on 04/21/20  Microalbumin / creatinine urine ratio  Result Value Ref Range   Microalb, Ur 1.7 0.0 - 1.9 mg/dL   Creatinine,U 196.1 mg/dL   Microalb Creat Ratio 0.9 0.0 - 30.0 mg/g  CBC with Differential/Platelet  Result Value Ref Range   WBC 3.1 (L) 4.0 - 10.5 K/uL   RBC 4.11 3.87 - 5.11 Mil/uL   Hemoglobin 10.6 (L) 12.0 - 15.0 g/dL   HCT 33.5 (L) 36 - 46 %   MCV 81.5 78.0 - 100.0 fl   MCHC 31.6 30.0 - 36.0 g/dL   RDW 16.5 (H) 11.5 - 15.5 %   Platelets 350.0 150 - 400 K/uL   Neutrophils Relative % 46.9 43 - 77 %   Lymphocytes Relative 40.4 12 - 46 %   Monocytes Relative 9.6 3 - 12 %   Eosinophils Relative 2.2 0 - 5 %   Basophils Relative 0.9 0 - 3 %   Neutro Abs 1.5 1.4 - 7.7 K/uL   Lymphs Abs 1.3 0.7 - 4.0 K/uL   Monocytes Absolute 0.3 0 - 1 K/uL   Eosinophils Absolute 0.1 0 - 0 K/uL   Basophils Absolute 0.0 0 - 0 K/uL  Hemoglobin A1c  Result Value Ref Range   Hgb A1c MFr Bld 6.3 4.6 - 6.5 %  TSH  Result Value Ref Range   TSH 1.62 0.35 - 4.50 uIU/mL  Comprehensive metabolic panel  Result Value Ref Range   Sodium 136 135 - 145 mEq/L   Potassium 4.5 3.5 - 5.1 mEq/L   Chloride 101 96 - 112 mEq/L   CO2 30 19 - 32 mEq/L   Glucose, Bld 86 70 - 99 mg/dL   BUN 12 6 - 23 mg/dL   Creatinine, Ser  0.76 0.40 - 1.20 mg/dL   Total Bilirubin 0.4 0.2 - 1.2 mg/dL   Alkaline Phosphatase 75 39 - 117 U/L   AST 18 0 - 37 U/L   ALT 12 0 - 35 U/L   Total Protein 7.2 6.0 - 8.3 g/dL   Albumin 4.1 3.5 - 5.2 g/dL   GFR 97.40 >60.00 mL/min   Calcium 9.0 8.4 - 10.5 mg/dL  Lipid panel  Result Value Ref Range   Cholesterol 222 (H) 0 - 200 mg/dL   Triglycerides 145.0 0 - 149 mg/dL   HDL 58.10 >39.00 mg/dL   VLDL 29.0 0.0 - 40.0 mg/dL   LDL Cholesterol 135 (H) 0 - 99 mg/dL   Total CHOL/HDL Ratio 4    NonHDL 164.34   Vitamin B1  Result Value Ref Range   Vitamin B1 (Thiamine) 8 8 - 30 nmol/L  VITAMIN D 25 Hydroxy (Vit-D Deficiency, Fractures)  Result Value Ref Range   VITD 13.29 (L) 30.00 - 100.00 ng/mL  Folate  Result Value Ref Range   Folate 7.6 >5.9 ng/mL  IBC panel  Result Value Ref Range   Iron 37 (L) 42 - 145 ug/dL   Transferrin 370.0 (H) 212.0 - 360.0 mg/dL   Saturation Ratios 7.1 (L) 20.0 - 50.0 %  Ferritin  Result Value Ref Range   Ferritin 4.3 (L) 10.0 - 291.0 ng/mL  Vitamin B12  Result Value Ref Range   Vitamin  B-12 1,050 (H) 211 - 911 pg/mL   Depression screen [facility] 2/9 04/28/2020 01/28/2019 01/28/2019 11/14/2017  Decreased Interest 0 0 0 1  Down, Depressed, Hopeless 0 0 0 2  PHQ - 2 Score 0 0 0 3  Altered sleeping - 0 - 0  Tired, decreased energy - 0 - 2  Change in appetite - 0 - 2  Feeling bad or failure about yourself  - 1 - 0  Trouble concentrating - 0 - 3  Moving slowly or fidgety/restless - 0 - 0  Suicidal thoughts - 0 - 0  PHQ-9 Score - 1 - 10   Assessment & Plan:  This visit occurred during the SARS-CoV-2 public health emergency.  Safety protocols were in place, including screening questions prior to the visit, additional usage of staff PPE, and extensive cleaning of exam room while observing appropriate contact time as indicated for disinfecting solutions.   Problem List Items Addressed This Visit    Vitamin D deficiency    rec start vit D 50,000 IU weekly -  sent to pharmacy      Vitamin B1 deficiency    rec continue thiamine 50m daily.       RESOLVED: Polyp of cervix    S/p excision last year.       OSA (obstructive sleep apnea)    H/o this prior to gastric sleeve, now with weight gain and am HA concern for recurrence. Discussed restarting CPAP use, update with effect on morning headaches.  Also reviewed sleep hygiene measures - poor sleep hygiene due to husband's sleep habits including TV on all night in bedroom. Recommend remove TV from bedroom - to discuss this with husband.       Obesity, Class I, BMI 30-34.9    Reviewed weight gain noted. Encouraged healthy diet and lifestyle choices to affect sustainable weight loss.  S/p sleeve gastrectomy       Morning headache    Likely OSA related - see above.       Relevant Medications   venlafaxine (EFFEXOR) 100 MG tablet   IDA (iron deficiency anemia)    IDA persists despite daily oral iron - will set up for IV iron infusion. Anticipate related to heavy periods.       Hypothyroid    Chronic, stable on current regimen.       Relevant Medications   levothyroxine (SYNTHROID) 88 MCG tablet   HTN (hypertension)    Chronic, stable off medication - continue to monitor.       Encounter for general adult medical examination with abnormal findings - Primary    Preventative protocols reviewed and updated unless pt declined. Discussed healthy diet and lifestyle.  Pap performed      Relevant Orders   Cytology - PAP   Depression with anxiety    Chronic, stable period on effexor 1064mbid. Continue.       Relevant Medications   venlafaxine (EFFEXOR) 100 MG tablet   Bariatric surgery status    Reviewed recent vitamin levels - see below.  rec start daily MVI post bariatric surgery       Abnormal Pap smear of cervix    Pap last yaer ASCUS with +HRHPV s/p reassuring colposcopy. Update pap today.       Relevant Orders   Cytology - PAP    Other Visit Diagnoses    Need for  influenza vaccination       Relevant Orders   Flu Vaccine QUAD 36+ mos IM (Completed)  Special screening for malignant neoplasms, colon       Relevant Orders   Fecal occult blood, imunochemical   Need for shingles vaccine       Relevant Orders   Varicella-zoster vaccine IM (Completed)       Meds ordered this encounter  Medications  . levothyroxine (SYNTHROID) 88 MCG tablet    Sig: Take 1 tablet (88 mcg total) by mouth daily.    Dispense:  90 tablet    Refill:  3  . venlafaxine (EFFEXOR) 100 MG tablet    Sig: Take 1 tablet (100 mg total) by mouth 2 (two) times daily.    Dispense:  180 tablet    Refill:  3  . Cholecalciferol (VITAMIN D3) 1.25 MG (50000 UT) TABS    Sig: Take 1 tablet by mouth once a week.    Dispense:  12 tablet    Refill:  3  . Multiple Vitamin (MULTIVITAMIN ADULT) TABS    Sig: Take 1 tablet by mouth daily.  Marland Kitchen thiamine (VITAMIN B-1) 50 MG tablet    Sig: Take 1 tablet (50 mg total) by mouth daily.   Orders Placed This Encounter  Procedures  . Fecal occult blood, imunochemical    Standing Status:   Future    Standing Expiration Date:   04/28/2021  . Flu Vaccine QUAD 36+ mos IM  . Varicella-zoster vaccine IM    Patient instructions: Flu shot  shingrix vaccine today. Schedule nurse visit in 2-6 months to complete series.  Increase water intake as your heart rate is staying elevated.  Pass by lab to pick up stool kit.  Call to schedule mammogram at your convenience: [facility] [phone] Take TV out of bedroom.  I recommend multivitamin daily. Start b1, vitamin D weekly (prescription strength sent in). Iron remains low despite daily replacement - we will set you up for iron infusion.  Return as needed or in 1 year for next physical.   Follow up plan: Return in about 1 year (around 04/28/2021), or if symptoms worsen or fail to improve, for annual exam, prior fasting for blood work.  Ria Bush, MD

## 2020-04-28 NOTE — Patient Instructions (Addendum)
Flu shot  shingrix vaccine today. Schedule nurse visit in 2-6 months to complete series.  Increase water intake as your heart rate is staying elevated.  Pass by lab to pick up stool kit.  Call to schedule mammogram at your convenience: [facility] [phone] Take TV out of bedroom.  I recommend multivitamin daily. Start b1, vitamin D weekly (prescription strength sent in). Iron remains low despite daily replacement - we will set you up for iron infusion.  Return as needed or in 1 year for next physical.   Health Maintenance, Female Adopting a healthy lifestyle and getting preventive care are important in promoting health and wellness. Ask your health care provider about:  The right schedule for you to have regular tests and exams.  Things you can do on your own to prevent diseases and keep yourself healthy. What should I know about diet, weight, and exercise? Eat a healthy diet   Eat a diet that includes plenty of vegetables, fruits, low-fat dairy products, and lean protein.  Do not eat a lot of foods that are high in solid fats, added sugars, or sodium. Maintain a healthy weight Body mass index (BMI) is used to identify weight problems. It estimates body fat based on height and weight. Your health care provider can help determine your BMI and help you achieve or maintain a healthy weight. Get regular exercise Get regular exercise. This is one of the most important things you can do for your health. Most adults should:  Exercise for at least 150 minutes each week. The exercise should increase your heart rate and make you sweat (moderate-intensity exercise).  Do strengthening exercises at least twice a week. This is in addition to the moderate-intensity exercise.  Spend less time sitting. Even light physical activity can be beneficial. Watch cholesterol and blood lipids Have your blood tested for lipids and cholesterol at 50 years of age, then have this test  every 5 years. Have your cholesterol levels checked more often if:  Your lipid or cholesterol levels are high.  You are older than 50 years of age.  You are at high risk for heart disease. What should I know about cancer screening? Depending on your health history and family history, you may need to have cancer screening at various ages. This may include screening for:  Breast cancer.  Cervical cancer.  Colorectal cancer.  Skin cancer.  Lung cancer. What should I know about heart disease, diabetes, and high blood pressure? Blood pressure and heart disease  High blood pressure causes heart disease and increases the risk of stroke. This is more likely to develop in people who have high blood pressure readings, are of African descent, or are overweight.  Have your blood pressure checked: ? Every 3-5 years if you are 18-39 years of age. ? Every year if you are 40 years old or older. Diabetes Have regular diabetes screenings. This checks your fasting blood sugar level. Have the screening done:  Once every three years after age 40 if you are at a normal weight and have a low risk for diabetes.  More often and at a younger age if you are overweight or have a high risk for diabetes. What should I know about preventing infection? Hepatitis B If you have a higher risk for hepatitis B, you should be screened for this virus. Talk with your health care provider to find out if you are at risk for hepatitis B infection. Hepatitis C Testing is recommended   for:  Everyone born from 1945 through 1965.  Anyone with known risk factors for hepatitis C. Sexually transmitted infections (STIs)  Get screened for STIs, including gonorrhea and chlamydia, if: ? You are sexually active and are younger than 50 years of age. ? You are older than 50 years of age and your health care provider tells you that you are at risk for this type of infection. ? Your sexual activity has changed since you were  last screened, and you are at increased risk for chlamydia or gonorrhea. Ask your health care provider if you are at risk.  Ask your health care provider about whether you are at high risk for HIV. Your health care provider may recommend a prescription medicine to help prevent HIV infection. If you choose to take medicine to prevent HIV, you should first get tested for HIV. You should then be tested every 3 months for as long as you are taking the medicine. Pregnancy  If you are about to stop having your period (premenopausal) and you may become pregnant, seek counseling before you get pregnant.  Take 400 to 800 micrograms (mcg) of folic acid every day if you become pregnant.  Ask for birth control (contraception) if you want to prevent pregnancy. Osteoporosis and menopause Osteoporosis is a disease in which the bones lose minerals and strength with aging. This can result in bone fractures. If you are 65 years old or older, or if you are at risk for osteoporosis and fractures, ask your health care provider if you should:  Be screened for bone loss.  Take a calcium or vitamin D supplement to lower your risk of fractures.  Be given hormone replacement therapy (HRT) to treat symptoms of menopause. Follow these instructions at home: Lifestyle  Do not use any products that contain nicotine or tobacco, such as cigarettes, e-cigarettes, and chewing tobacco. If you need help quitting, ask your health care provider.  Do not use street drugs.  Do not share needles.  Ask your health care provider for help if you need support or information about quitting drugs. Alcohol use  Do not drink alcohol if: ? Your health care provider tells you not to drink. ? You are pregnant, may be pregnant, or are planning to become pregnant.  If you drink alcohol: ? Limit how much you use to 0-1 drink a day. ? Limit intake if you are breastfeeding.  Be aware of how much alcohol is in your drink. In the [place]  one drink equals one 12 oz bottle of beer (355 mL), one 5 oz glass of wine (148 mL), or one 1 oz glass of hard liquor (44 mL). General instructions  Schedule regular health, dental, and eye exams.  Stay current with your vaccines.  Tell your health care provider if: ? You often feel depressed. ? You have ever been abused or do not feel safe at home. Summary  Adopting a healthy lifestyle and getting preventive care are important in promoting health and wellness.  Follow your health care provider's instructions about healthy diet, exercising, and getting tested or screened for diseases.  Follow your health care provider's instructions on monitoring your cholesterol and blood pressure. This information is not intended to replace advice given to you by your health care provider. Make sure you discuss any questions you have with your health care provider. Document Revised: 07/17/2018 Document Reviewed: 07/17/2018 Elsevier Patient Education  2020 Elsevier Inc.  

## 2020-04-29 ENCOUNTER — Encounter: Payer: Self-pay | Admitting: Family Medicine

## 2020-04-29 DIAGNOSIS — R519 Headache, unspecified: Secondary | ICD-10-CM | POA: Insufficient documentation

## 2020-04-29 NOTE — Assessment & Plan Note (Signed)
Reviewed weight gain noted. Encouraged healthy diet and lifestyle choices to affect sustainable weight loss.  S/p sleeve gastrectomy

## 2020-04-29 NOTE — Assessment & Plan Note (Signed)
Pap last yaer ASCUS with +HRHPV s/p reassuring colposcopy. Update pap today.

## 2020-04-29 NOTE — Assessment & Plan Note (Addendum)
Reviewed recent vitamin levels - see below.  rec start daily MVI post bariatric surgery

## 2020-04-29 NOTE — Assessment & Plan Note (Addendum)
S/p excision last year.

## 2020-04-29 NOTE — Assessment & Plan Note (Signed)
Chronic, stable off medication - continue to monitor.

## 2020-04-29 NOTE — Assessment & Plan Note (Addendum)
IDA persists despite daily oral iron - will set up for IV iron infusion. Anticipate related to heavy periods.

## 2020-04-29 NOTE — Assessment & Plan Note (Signed)
rec continue thiamine 50mg  daily.

## 2020-04-29 NOTE — Assessment & Plan Note (Signed)
Chronic, stable period on effexor 100mg  bid. Continue.

## 2020-04-29 NOTE — Assessment & Plan Note (Signed)
Chronic, stable on current regimen.  

## 2020-04-29 NOTE — Assessment & Plan Note (Signed)
rec start vit D 50,000 IU weekly - sent to pharmacy

## 2020-04-29 NOTE — Assessment & Plan Note (Addendum)
H/o this prior to gastric sleeve, now with weight gain and am HA concern for recurrence. Discussed restarting CPAP use, update with effect on morning headaches.  Also reviewed sleep hygiene measures - poor sleep hygiene due to husband's sleep habits including TV on all night in bedroom. Recommend remove TV from bedroom - to discuss this with husband.

## 2020-04-29 NOTE — Assessment & Plan Note (Signed)
Likely OSA related - see above.

## 2020-04-30 ENCOUNTER — Telehealth: Payer: Self-pay

## 2020-04-30 NOTE — Telephone Encounter (Addendum)
Filled and in Lisa's box 

## 2020-04-30 NOTE — Telephone Encounter (Signed)
Placed order form for iron infusion in Dr. G's box.  

## 2020-05-03 LAB — CYTOLOGY - PAP
Comment: NEGATIVE
Diagnosis: NEGATIVE
High risk HPV: NEGATIVE

## 2020-05-04 NOTE — Telephone Encounter (Signed)
Called pt's ins co several times to do pre-cert.  Line busy.  Will try again later.   [Order form in [employer]film[employer]video on [employer].]

## 2020-05-06 ENCOUNTER — Encounter: Payer: Self-pay | Admitting: Family Medicine

## 2020-05-06 ENCOUNTER — Other Ambulatory Visit (INDEPENDENT_AMBULATORY_CARE_PROVIDER_SITE_OTHER): Payer: 59

## 2020-05-06 DIAGNOSIS — Z1211 Encounter for screening for malignant neoplasm of colon: Secondary | ICD-10-CM

## 2020-05-06 LAB — FECAL OCCULT BLOOD, GUAIAC: Fecal Occult Blood: NEGATIVE

## 2020-05-06 LAB — FECAL OCCULT BLOOD, IMMUNOCHEMICAL: Fecal Occult Bld: NEGATIVE

## 2020-05-06 NOTE — Telephone Encounter (Signed)
Spoke with Marney Setting of [facility] for pre-cert for iron infusion.  States pre-cert was not needed.  Gave ref # Raiford Noble M 2:26 pm CST.  Faxed order info to [facility].  Lvm asking for a call back to schedule infusion.

## 2020-05-07 NOTE — Telephone Encounter (Signed)
Spoke with [facility] scheduling pt on 05/13/20 at 12:00.  Lvm asking pt to call back.  Need to inform her the iron infusion is scheduled for Thurs, 05/13/20 at 12:00.  Go to [facility] main entrance (entrance 'A') and pull up to valet parking.  She will then be escorted to the clinic.  Plz call [phone] if you need to r/s or c/x appt.

## 2020-05-07 NOTE — Telephone Encounter (Signed)
Pt returning call.  I relayed appt info below.  Pt verbalizes understanding.

## 2020-05-12 ENCOUNTER — Other Ambulatory Visit (HOSPITAL_COMMUNITY): Payer: Self-pay

## 2020-05-12 NOTE — Discharge Instructions (Signed)

## 2020-05-13 ENCOUNTER — Other Ambulatory Visit: Payer: Self-pay

## 2020-05-13 ENCOUNTER — Encounter (HOSPITAL_COMMUNITY)
Admission: RE | Admit: 2020-05-13 | Discharge: 2020-05-13 | Disposition: A | Discharge status: Home / Self Care | Payer: 59 | Source: Ambulatory Visit | Attending: Family Medicine | Admitting: Family Medicine

## 2020-05-13 DIAGNOSIS — D509 Iron deficiency anemia, unspecified: Secondary | ICD-10-CM | POA: Insufficient documentation

## 2020-05-13 MED ORDER — SODIUM CHLORIDE 0.9 % IV SOLN
510.0000 mg | INTRAVENOUS | Status: DC
  Administered 2020-05-13: 510 mg via INTRAVENOUS
  Filled 2020-05-13: qty 17

## 2020-05-20 ENCOUNTER — Encounter (HOSPITAL_COMMUNITY): Payer: 59

## 2020-05-26 ENCOUNTER — Encounter (HOSPITAL_COMMUNITY)
Admission: RE | Admit: 2020-05-26 | Discharge: 2020-05-26 | Disposition: A | Discharge status: Home / Self Care | Payer: 59 | Source: Ambulatory Visit | Attending: Family Medicine | Admitting: Family Medicine

## 2020-05-26 ENCOUNTER — Other Ambulatory Visit: Payer: Self-pay

## 2020-05-26 DIAGNOSIS — D509 Iron deficiency anemia, unspecified: Secondary | ICD-10-CM | POA: Diagnosis not present

## 2020-05-26 MED ORDER — SODIUM CHLORIDE 0.9 % IV SOLN
510.0000 mg | INTRAVENOUS | Status: DC
  Administered 2020-05-26: 510 mg via INTRAVENOUS
  Filled 2020-05-26: qty 510

## 2020-06-16 ENCOUNTER — Encounter: Payer: Self-pay | Admitting: Family Medicine

## 2020-06-16 ENCOUNTER — Ambulatory Visit (INDEPENDENT_AMBULATORY_CARE_PROVIDER_SITE_OTHER)
Admission: RE | Admit: 2020-06-16 | Discharge: 2020-06-16 | Disposition: A | Discharge status: Home / Self Care | Payer: 59 | Source: Ambulatory Visit | Attending: Family Medicine | Admitting: Family Medicine

## 2020-06-16 ENCOUNTER — Other Ambulatory Visit: Payer: Self-pay

## 2020-06-16 ENCOUNTER — Ambulatory Visit (INDEPENDENT_AMBULATORY_CARE_PROVIDER_SITE_OTHER): Payer: 59 | Admitting: Family Medicine

## 2020-06-16 VITALS — BP 132/80 | HR 88 | Ht 65.25 in | Wt 215.0 lb

## 2020-06-16 DIAGNOSIS — S92351A Displaced fracture of fifth metatarsal bone, right foot, initial encounter for closed fracture: Secondary | ICD-10-CM | POA: Diagnosis not present

## 2020-06-16 DIAGNOSIS — E559 Vitamin D deficiency, unspecified: Secondary | ICD-10-CM | POA: Diagnosis not present

## 2020-06-16 DIAGNOSIS — R12 Heartburn: Secondary | ICD-10-CM | POA: Insufficient documentation

## 2020-06-16 DIAGNOSIS — S99921A Unspecified injury of right foot, initial encounter: Secondary | ICD-10-CM | POA: Diagnosis not present

## 2020-06-16 DIAGNOSIS — S92309A Fracture of unspecified metatarsal bone(s), unspecified foot, initial encounter for closed fracture: Secondary | ICD-10-CM | POA: Insufficient documentation

## 2020-06-16 MED ORDER — MELOXICAM 15 MG PO TABS: 15.0000 mg | ORAL_TABLET | Freq: Every day | ORAL | 0 refills | Status: DC | PRN

## 2020-06-16 NOTE — Progress Notes (Signed)
Subjective:    Patient ID: Diana Cox, female    DOB: January 23, 1971, 49 y.o.   MRN: [id number]  This visit occurred during the SARS-CoV-2 public health emergency.  Safety protocols were in place, including screening questions prior to the visit, additional usage of staff PPE, and extensive cleaning of exam room while observing appropriate contact time as indicated for disinfecting solutions.    HPI Pt presents with foot injury (49 yo pt of Dr Reece Agar with hx of bariatric surgery in the past) and vit D deficiency  Wt Readings from Last 3 Encounters:  06/16/20 215 lb (97.5 kg)  04/28/20 212 lb 1 oz (96.2 kg)  04/02/19 204 lb (92.5 kg)   35.50 kg/m    R foot  Stepped wrong on level surface today  Hurt immediately  She can put pressure on it but it hurts  Does not throb when staying still  A little swelling  Pain is worst on the lateral foot- 5th metatarsal area  Ankle does not hurt   Has had a h/o broken toe on other foot in the past   Xray today DG Foot Complete Right  Result Date: 06/16/2020 CLINICAL DATA:  Right lateral foot pain after inversion injury. EXAM: RIGHT FOOT COMPLETE - 3+ VIEW COMPARISON:  None. FINDINGS: Acute mildly displaced oblique fracture of the fifth metatarsal diaphysis. No additional fracture. No dislocation. Joint spaces are preserved. Bone mineralization is normal. Lateral forefoot soft tissue swelling. IMPRESSION: 1. Acute mildly displaced fifth metatarsal fracture. Electronically Signed   By: Obie Dredge M.D.   On: 06/16/2020 14:31     Patient Active Problem List   Diagnosis Date Noted  . Heartburn 06/16/2020  . Morbid obesity (HCC) 06/16/2020  . Right foot injury 06/16/2020  . Morning headache 04/29/2020  . OSA (obstructive sleep apnea) 04/28/2020  . Abnormal Pap smear of cervix 03/17/2019  . Anterior knee pain, right 08/01/2018  . Vitamin B1 deficiency 11/20/2017  . Vitamin D deficiency 11/20/2017  . Encounter for general adult medical  examination with abnormal findings 10/06/2014  . IDA (iron deficiency anemia) 12/10/2013  . Bariatric surgery status 03/03/2013  . Hyperglycemia 08/27/2012  . Obesity, Class I, BMI 30-34.9 08/27/2012  . Depression with anxiety   . HTN (hypertension)   . Hypothyroid    Past Medical History:  Diagnosis Date  . Anemia    during pregnancy  . Bariatric surgery status 2013  . Depression with anxiety   . Dermatitis    ?lichen planus s/p biopsy by derm; cx neg  . History of chicken pox    as child  . HTN (hypertension)    improved with bypass  . Hypothyroid    goiter  . Obesity   . Polyp of cervix 03/17/2019   Excised 03/2019   Past Surgical History:  Procedure Laterality Date  . fingertip amputation  1999   R index  . LAPAROSCOPIC GASTRIC SLEEVE RESECTION  07/2012   Dr. Alva Garnet  . TUBAL LIGATION  2003   bilat   Social History   Tobacco Use  . Smoking status: Never Smoker  . Smokeless tobacco: Never Used  Vaping Use  . Vaping Use: Never used  Substance Use Topics  . Alcohol use: Yes    Comment: socially  . Drug use: No   Family History  Problem Relation Age of Onset  . Hypertension Mother   . Diabetes Mother   . Hypertension Father   . Obesity Father   . Diabetes Father   .  ALS Maternal Aunt   . ALS Maternal Uncle   . Coronary artery disease Paternal Uncle 50       bypass  . ALS Maternal Grandmother   . Diabetes Maternal Grandfather   . Hypertension Paternal Grandmother   . Diabetes Paternal Grandmother   . Hypertension Paternal Grandfather   . Diabetes Paternal Grandfather   . Coronary artery disease Paternal Grandfather 50       bypass  . Bipolar disorder Son        ADHD/OD  . Cancer Neg Hx   . Stroke Neg Hx    No Known Allergies Current Outpatient Medications on File Prior to Visit  Medication Sig Dispense Refill  . Cholecalciferol (VITAMIN D3) 1.25 MG (50000 UT) CAPS Take 1 capsule by mouth once a week.    . Cholecalciferol (VITAMIN D3) 1.25 MG  (50000 UT) TABS Take 1 tablet by mouth once a week. 12 tablet 3  . ferrous sulfate 325 (65 FE) MG tablet Take 1 tablet (325 mg total) by mouth daily with breakfast.    . levothyroxine (SYNTHROID) 88 MCG tablet Take 1 tablet (88 mcg total) by mouth daily. 90 tablet 3  . Multiple Vitamin (MULTIVITAMIN ADULT) TABS Take 1 tablet by mouth daily.    Marland Kitchen thiamine (VITAMIN B-1) 50 MG tablet Take 1 tablet (50 mg total) by mouth daily.    Marland Kitchen venlafaxine (EFFEXOR) 100 MG tablet Take 1 tablet (100 mg total) by mouth 2 (two) times daily. 180 tablet 3   No current facility-administered medications on file prior to visit.    Review of Systems  Constitutional: Negative for activity change, appetite change, fatigue, fever and unexpected weight change.  HENT: Negative for congestion, ear pain, rhinorrhea, sinus pressure and sore throat.   Eyes: Negative for pain, redness and visual disturbance.  Respiratory: Negative for cough, shortness of breath and wheezing.   Cardiovascular: Negative for chest pain and palpitations.  Gastrointestinal: Negative for abdominal pain, blood in stool, constipation and diarrhea.  Endocrine: Negative for polydipsia and polyuria.  Genitourinary: Negative for dysuria, frequency and urgency.  Musculoskeletal: Negative for arthralgias, back pain and myalgias.       Right foot pain after injury  Skin: Negative for pallor and rash.  Allergic/Immunologic: Negative for environmental allergies.  Neurological: Negative for dizziness, syncope and headaches.  Hematological: Negative for adenopathy. Does not bruise/bleed easily.  Psychiatric/Behavioral: Negative for decreased concentration and dysphoric mood. The patient is not nervous/anxious.        Objective:   Physical Exam Constitutional:      General: She is not in acute distress.    Appearance: Normal appearance. She is obese. She is not ill-appearing.  Cardiovascular:     Rate and Rhythm: Normal rate and regular rhythm.      Pulses: Normal pulses.     Comments: Nl perfusion of R foot  Musculoskeletal:     Right ankle: Normal. No tenderness. Normal range of motion.     Right foot: Normal capillary refill. Swelling present. No crepitus. Normal pulse.     Comments: R foot  Point tender over distal 5th metatarsal  Focal swelling and slight ecchymosis as well (notable lump) Nl rom of foot and toes Can bear partial weight (uncomfortable)   Skin:    General: Skin is warm and dry.     Findings: No erythema or rash.  Neurological:     Mental Status: She is alert.     Sensory: No sensory deficit.  Motor: No weakness.  Psychiatric:        Mood and Affect: Mood normal.           Assessment & Plan:   Problem List Items Addressed This Visit      Musculoskeletal and Integument   Metatarsal fracture - Primary    5th metatarsal fracture -mildly displaced from inversion of foot  Surprised she is not in more pain  Urgent ref made to orthopedics (appt tomorrow am)  Adv to stay off of it  (post op shoe not comfortable) and ice/elevation  meloxicam for pain 15 mg with food qd prn  inst to call if symptoms suddenly worsen      Relevant Orders   Ambulatory referral to Orthopedic Surgery     Other   Right foot injury   Relevant Orders   DG Foot Complete Right (Completed)   Ambulatory referral to Orthopedic Surgery

## 2020-06-16 NOTE — Patient Instructions (Signed)
Use ice and elevate foot when you can  Take meloxicam with food daily for pain and inflammation   I placed an orthopedic referral- we will be calling you to arrange that   If symptoms suddenly change please let me know

## 2020-06-16 NOTE — Assessment & Plan Note (Signed)
Given metatarsal fx today-urged her to stay on vit D May need to consider dexa once recovered

## 2020-06-16 NOTE — Assessment & Plan Note (Signed)
5th metatarsal fracture -mildly displaced from inversion of foot  Surprised she is not in more pain  Urgent ref made to orthopedics (appt tomorrow am)  Adv to stay off of it  (post op shoe not comfortable) and ice/elevation  meloxicam for pain 15 mg with food qd prn  inst to call if symptoms suddenly worsen

## 2020-07-20 ENCOUNTER — Other Ambulatory Visit: Payer: Self-pay | Admitting: Family Medicine

## 2020-07-20 NOTE — Telephone Encounter (Signed)
Meloxicam Last filled:  06/16/20, #30 Last OV:  04/28/20, CPE Next OV:  none

## 2020-09-16 ENCOUNTER — Encounter: Payer: Self-pay | Admitting: Family Medicine

## 2021-04-01 IMAGING — DX DG FOOT COMPLETE 3+V*R*
3 series · 3 of 3 positions shown · non-contrast
Comparison: None.

CLINICAL DATA: Right lateral foot pain after inversion injury.

EXAM:
RIGHT FOOT COMPLETE - 3+ VIEW

[foot ap]
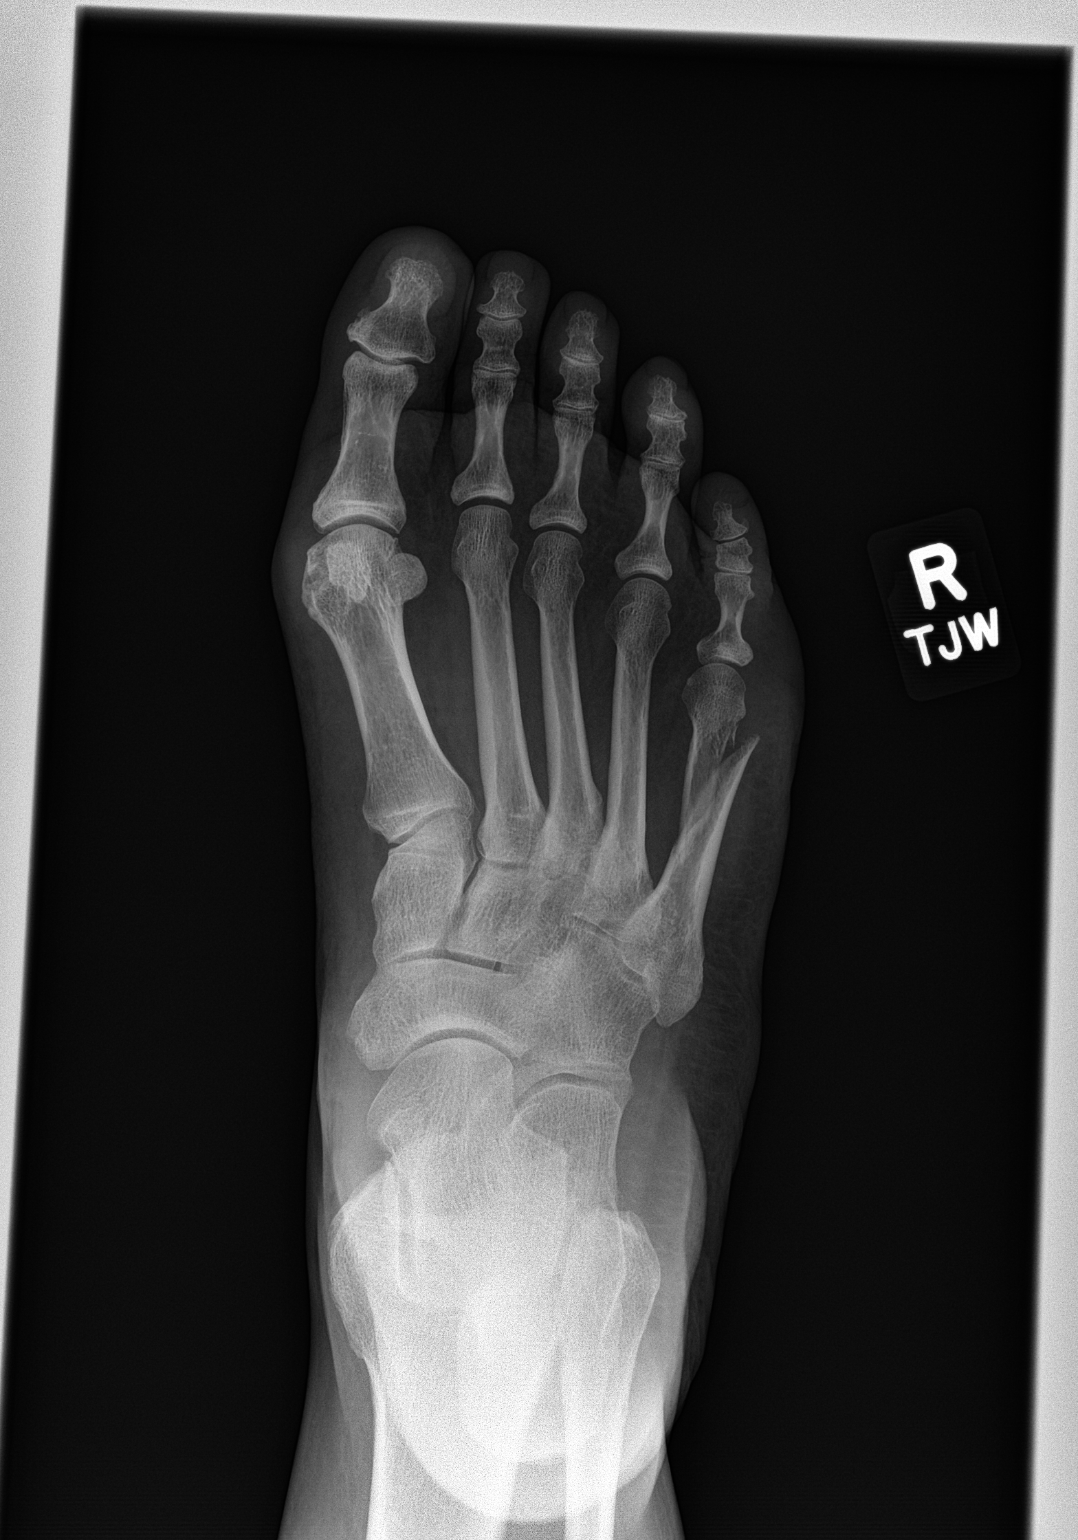

[foot obl]
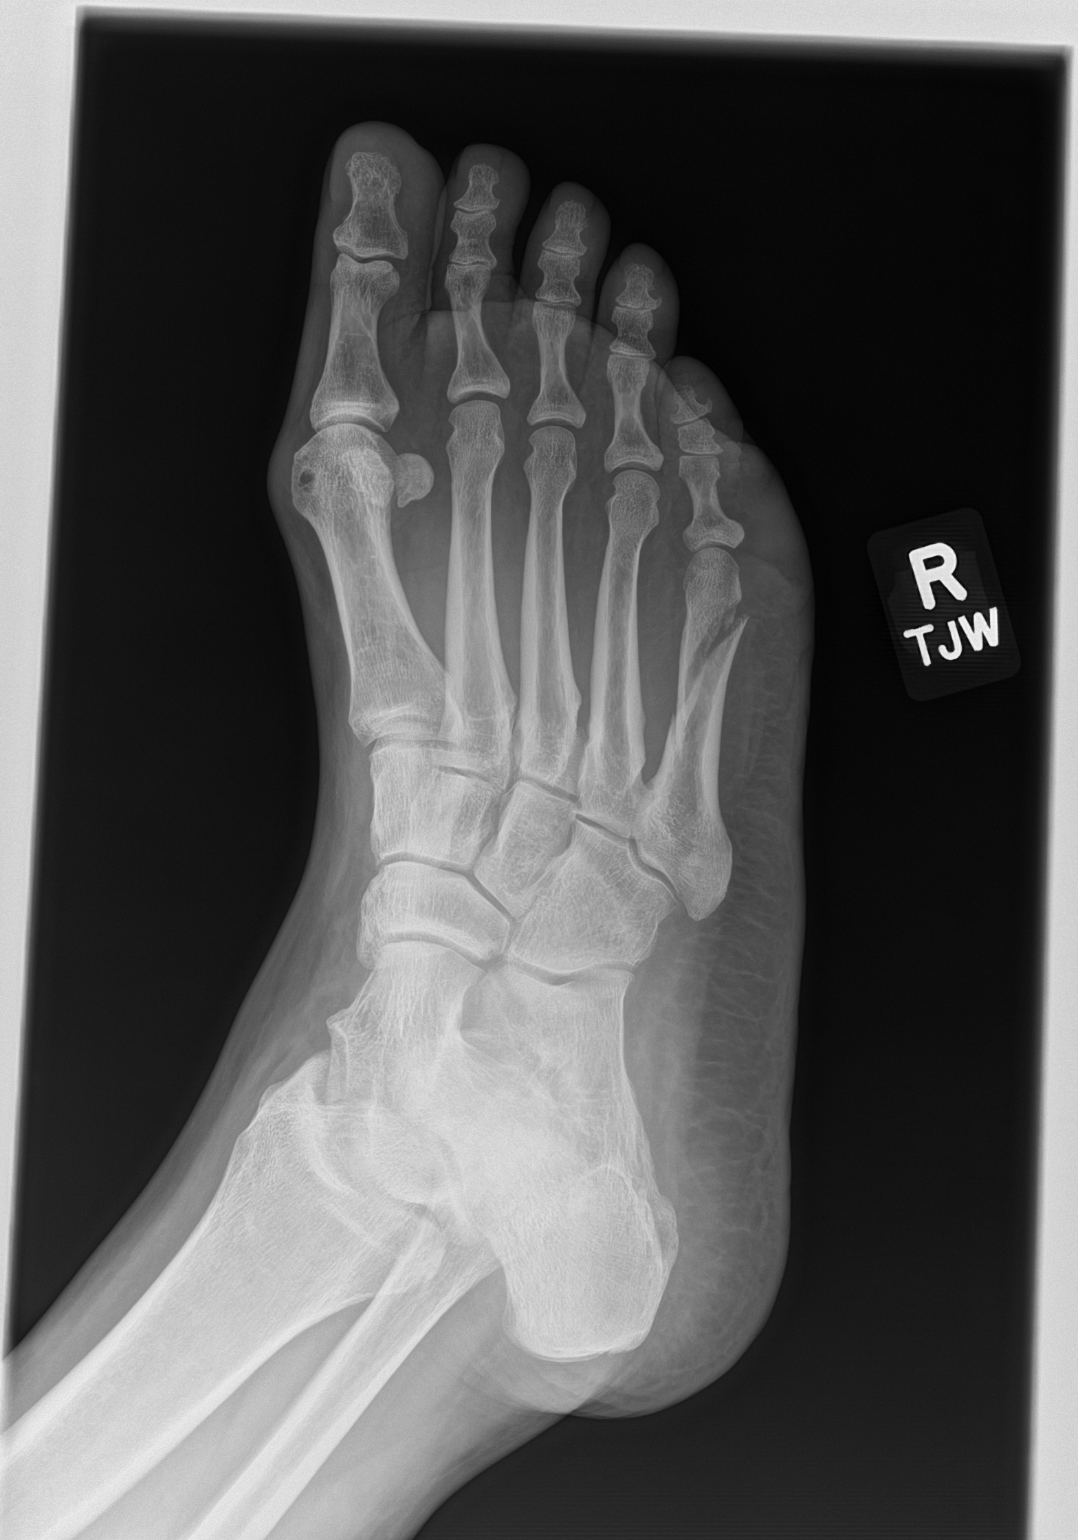

[foot lat]
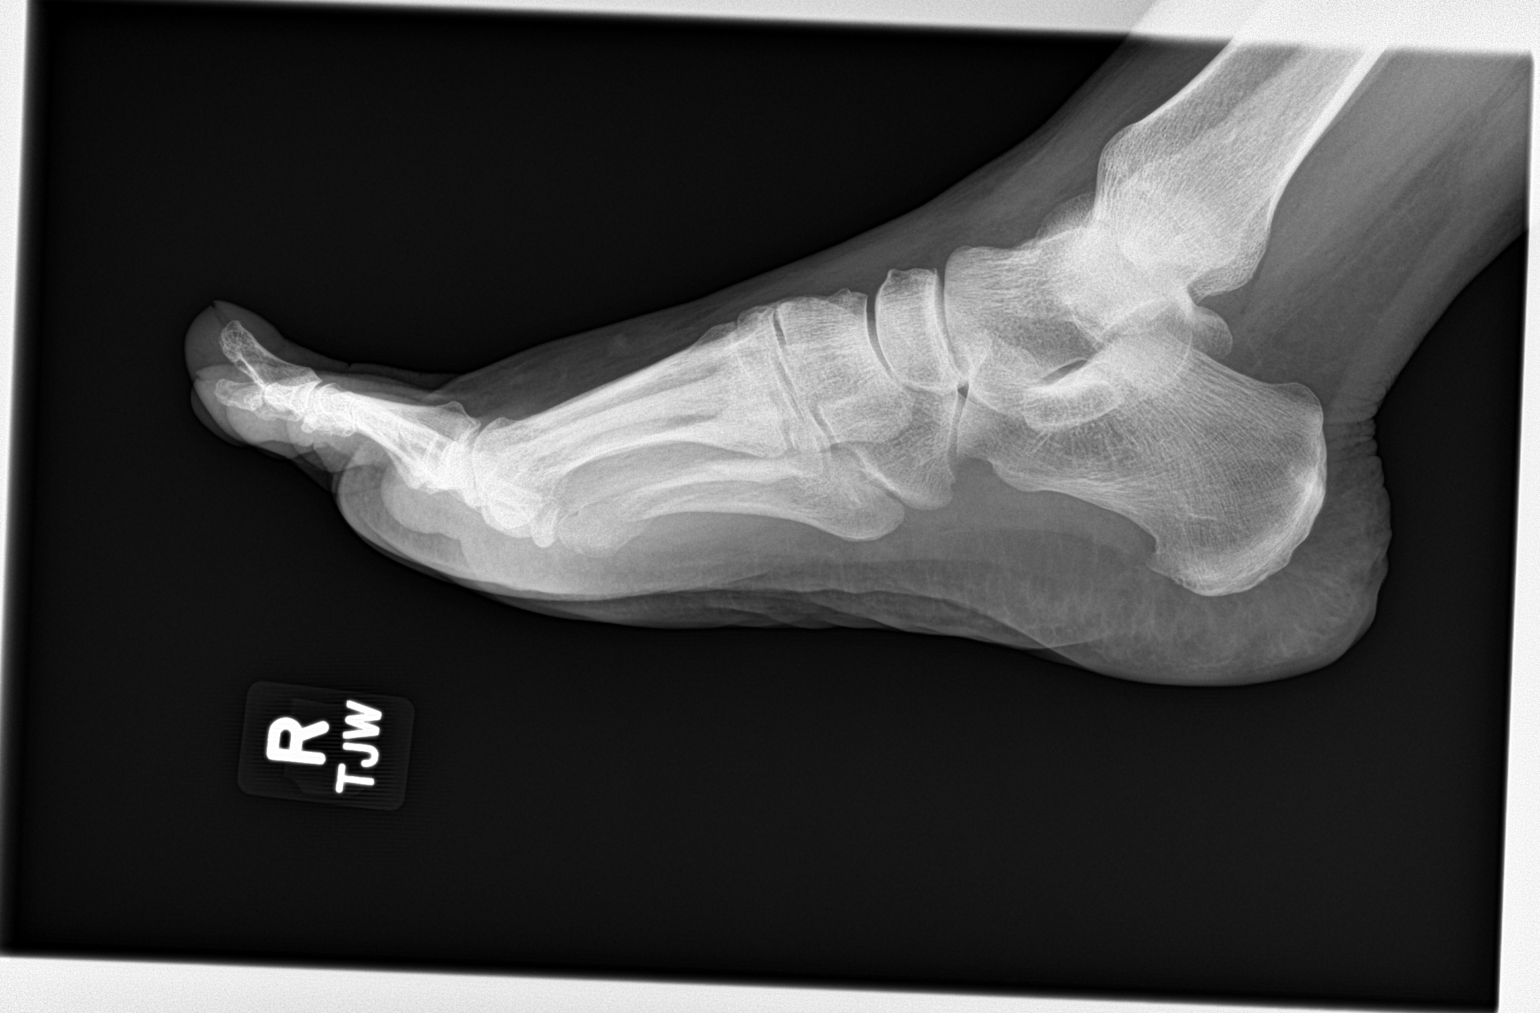

[3 of 3 positions shown; findings below may reference images not displayed]

FINDINGS: Acute mildly displaced oblique fracture of the fifth metatarsal
diaphysis. No additional fracture. No dislocation. Joint spaces are
preserved. Bone mineralization is normal. Lateral forefoot soft
tissue swelling.
IMPRESSION: 1. Acute mildly displaced fifth metatarsal fracture.

## 2021-04-22 ENCOUNTER — Encounter: Payer: Self-pay | Admitting: Family Medicine

## 2021-05-13 ENCOUNTER — Encounter: Payer: Self-pay | Admitting: Family Medicine

## 2021-05-13 ENCOUNTER — Ambulatory Visit (INDEPENDENT_AMBULATORY_CARE_PROVIDER_SITE_OTHER): Payer: 59 | Admitting: Family Medicine

## 2021-05-13 ENCOUNTER — Other Ambulatory Visit: Payer: Self-pay

## 2021-05-13 VITALS — BP 148/92 | HR 100 | Temp 97.4°F | Ht 65.5 in | Wt 209.2 lb

## 2021-05-13 DIAGNOSIS — E559 Vitamin D deficiency, unspecified: Secondary | ICD-10-CM

## 2021-05-13 DIAGNOSIS — Z9884 Bariatric surgery status: Secondary | ICD-10-CM | POA: Diagnosis not present

## 2021-05-13 DIAGNOSIS — D509 Iron deficiency anemia, unspecified: Secondary | ICD-10-CM

## 2021-05-13 DIAGNOSIS — G4733 Obstructive sleep apnea (adult) (pediatric): Secondary | ICD-10-CM

## 2021-05-13 DIAGNOSIS — Z23 Encounter for immunization: Secondary | ICD-10-CM

## 2021-05-13 DIAGNOSIS — Z1211 Encounter for screening for malignant neoplasm of colon: Secondary | ICD-10-CM

## 2021-05-13 DIAGNOSIS — E669 Obesity, unspecified: Secondary | ICD-10-CM

## 2021-05-13 DIAGNOSIS — F418 Other specified anxiety disorders: Secondary | ICD-10-CM

## 2021-05-13 DIAGNOSIS — E039 Hypothyroidism, unspecified: Secondary | ICD-10-CM

## 2021-05-13 DIAGNOSIS — R739 Hyperglycemia, unspecified: Secondary | ICD-10-CM

## 2021-05-13 DIAGNOSIS — Z0000 Encounter for general adult medical examination without abnormal findings: Secondary | ICD-10-CM | POA: Diagnosis not present

## 2021-05-13 DIAGNOSIS — R87610 Atypical squamous cells of undetermined significance on cytologic smear of cervix (ASC-US): Secondary | ICD-10-CM

## 2021-05-13 DIAGNOSIS — E66811 Obesity, class 1: Secondary | ICD-10-CM

## 2021-05-13 DIAGNOSIS — I10 Essential (primary) hypertension: Secondary | ICD-10-CM

## 2021-05-13 DIAGNOSIS — E519 Thiamine deficiency, unspecified: Secondary | ICD-10-CM

## 2021-05-13 LAB — COMPREHENSIVE METABOLIC PANEL
ALT: 10 U/L (ref 0–35)
AST: 16 U/L (ref 0–37)
Albumin: 4.1 g/dL (ref 3.5–5.2)
Alkaline Phosphatase: 71 U/L (ref 39–117)
BUN: 16 mg/dL (ref 6–23)
CO2: 31 mEq/L (ref 19–32)
Calcium: 9.2 mg/dL (ref 8.4–10.5)
Chloride: 103 mEq/L (ref 96–112)
Creatinine, Ser: 0.72 mg/dL (ref 0.40–1.20)
GFR: 96.93 mL/min (ref >60.00)
Glucose, Bld: 78 mg/dL (ref 70–99)
Potassium: 4.5 mEq/L (ref 3.5–5.1)
Sodium: 138 mEq/L (ref 135–145)
Total Bilirubin: 0.6 mg/dL (ref 0.2–1.2)
Total Protein: 6.9 g/dL (ref 6.0–8.3)

## 2021-05-13 LAB — LIPID PANEL
Cholesterol: 202 mg/dL — ABNORMAL HIGH (ref 0–200)
HDL: 56.3 mg/dL (ref >39.00)
LDL Cholesterol: 126 mg/dL — ABNORMAL HIGH (ref 0–99)
NonHDL: 145.74
Total CHOL/HDL Ratio: 4
Triglycerides: 98 mg/dL (ref 0.0–149.0)
VLDL: 19.6 mg/dL (ref 0.0–40.0)

## 2021-05-13 LAB — FOLATE: Folate: 8.2 ng/mL (ref >5.9)

## 2021-05-13 LAB — FERRITIN: Ferritin: 5.7 ng/mL — ABNORMAL LOW (ref 10.0–291.0)

## 2021-05-13 LAB — CBC WITH DIFFERENTIAL/PLATELET
Basophils Absolute: 0 10*3/uL (ref 0.0–0.1)
Basophils Relative: 0.6 % (ref 0.0–3.0)
Eosinophils Absolute: 0 10*3/uL (ref 0.0–0.7)
Eosinophils Relative: 1.3 % (ref 0.0–5.0)
HCT: 34.4 % — ABNORMAL LOW (ref 36.0–46.0)
Hemoglobin: 10.8 g/dL — ABNORMAL LOW (ref 12.0–15.0)
Lymphocytes Relative: 32.7 % (ref 12.0–46.0)
Lymphs Abs: 0.9 10*3/uL (ref 0.7–4.0)
MCHC: 31.5 g/dL (ref 30.0–36.0)
MCV: 81.8 fl (ref 78.0–100.0)
Monocytes Absolute: 0.3 10*3/uL (ref 0.1–1.0)
Monocytes Relative: 9.4 % (ref 3.0–12.0)
Neutro Abs: 1.6 10*3/uL (ref 1.4–7.7)
Neutrophils Relative %: 56 % (ref 43.0–77.0)
Platelets: 318 10*3/uL (ref 150.0–400.0)
RBC: 4.2 Mil/uL (ref 3.87–5.11)
RDW: 17 % — ABNORMAL HIGH (ref 11.5–15.5)
WBC: 2.9 10*3/uL — ABNORMAL LOW (ref 4.0–10.5)

## 2021-05-13 LAB — VITAMIN D 25 HYDROXY (VIT D DEFICIENCY, FRACTURES): VITD: 22.85 ng/mL — ABNORMAL LOW (ref 30.00–100.00)

## 2021-05-13 LAB — HEMOGLOBIN A1C: Hgb A1c MFr Bld: 6 % (ref 4.6–6.5)

## 2021-05-13 LAB — VITAMIN B12: Vitamin B-12: 589 pg/mL (ref 211–911)

## 2021-05-13 LAB — TSH: TSH: 0.57 u[IU]/mL (ref 0.35–5.50)

## 2021-05-13 LAB — IBC PANEL
Iron: 52 ug/dL (ref 42–145)
Saturation Ratios: 11.2 % — ABNORMAL LOW (ref 20.0–50.0)
TIBC: 466.2 ug/dL — ABNORMAL HIGH (ref 250.0–450.0)
Transferrin: 333 mg/dL (ref 212.0–360.0)

## 2021-05-13 MED ORDER — VITAMIN D3 1.25 MG (50000 UT) PO TABS: 1.0000 | ORAL_TABLET | ORAL | 3 refills | Status: AC

## 2021-05-13 MED ORDER — LEVOTHYROXINE SODIUM 88 MCG PO TABS: 88.0000 ug | ORAL_TABLET | Freq: Every day | ORAL | 3 refills | Status: AC

## 2021-05-13 MED ORDER — VENLAFAXINE HCL 100 MG PO TABS
100.0000 mg | ORAL_TABLET | Freq: Two times a day (BID) | ORAL | 3 refills | Status: DC
Start: 2021-05-13 — End: 2021-07-27

## 2021-05-13 NOTE — Progress Notes (Signed)
Patient ID: Diana Cox, female    DOB: 11-07-1970, 50 y.o.   MRN: [id number]  This visit was conducted in person.  BP (!) 148/92 (BP Location: Right Arm, Cuff Size: Large)   Pulse 100   Temp (!) 97.4 F (36.3 C) (Temporal)   Ht 5' 5.5" (1.664 m)   Wt 209 lb 3 oz (94.9 kg)   LMP 04/18/2021   SpO2 99%   BMI 34.28 kg/m    CC: CPE Subjective:   HPI: Diana Cox is a 50 y.o. female presenting on 05/13/2021 for Annual Exam   S/p lap gastric sleeve.  Currently taking vitamin D, thiamine, iron 332m daily. Continues levothyroxine and venlafaxine.   BP elevated today - hasn't been checking pressures at home but notes she hasn't been managing her health well.   Not using CPAP machine.   Preventative: Colon cancer screening - iFOB yearly  [facility] [place]  Well woman exam - pap with ASCUS HR HPV positive 03/2019, cervical polyp removed by GYN - s/p colposcopy 03/2019 - minimal atypia rec rpt 1 yr. pap smear normal with HRHPV neg 04/2020, rec rpt 1 yr. Rpt today.  S/p BTL 2003  LMP about a month ago, regular - is due to start period [place] Tdap - 2012 Shingrix - 04/2020, today Seat belt use discussed Sunscreen use discussed, no changing moles on skin.  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 Interim medical history since our last visit reviewed. Allergies and medications reviewed and updated. Outpatient Medications Prior to Visit  Medication Sig  Dispense Refill   ferrous sulfate 325 (65 FE) MG tablet Take 1 tablet (325 mg total) by mouth daily with breakfast.     meloxicam (MOBIC) 15 MG tablet TAKE 1 TABLET (15 MG TOTAL) BY MOUTH DAILY AS NEEDED FOR PAIN. WITH FOOD 30 tablet 0   Multiple Vitamin (MULTIVITAMIN ADULT) TABS Take 1 tablet by mouth daily.     thiamine (VITAMIN B-1) 50 MG tablet Take 1 tablet (50 mg total) by mouth daily.     Cholecalciferol (VITAMIN D3) 1.25 MG (50000 UT) CAPS Take 1 capsule by mouth once a week.     levothyroxine (SYNTHROID) 88 MCG tablet Take 1 tablet (88 mcg total) by mouth daily. 90 tablet 3   venlafaxine (EFFEXOR) 100 MG tablet Take 1 tablet (100 mg total) by mouth 2 (two) times daily. 180 tablet 3   Cholecalciferol (VITAMIN D3) 1.25 MG (50000 UT) TABS Take 1 tablet by mouth once a week. 12 tablet 3   No facility-administered medications prior to visit.     Per HPI unless specifically indicated in ROS section below Review of Systems  Constitutional:  Negative for activity change, appetite change, chills, fatigue, fever and unexpected weight change.  HENT:  Negative for hearing loss.   Eyes:  Negative for visual disturbance.  Respiratory:  Positive for cough. Negative for chest tightness, shortness of breath and wheezing.  Cardiovascular:  Negative for chest pain, palpitations and leg swelling.  Gastrointestinal:  Negative for abdominal distention, abdominal pain, blood in stool, constipation, diarrhea, nausea and vomiting.  Genitourinary:  Negative for difficulty urinating and hematuria.  Musculoskeletal:  Negative for arthralgias, myalgias and neck pain.  Skin:  Negative for rash.  Neurological:  Negative for dizziness, seizures, syncope and headaches.  Hematological:  Negative for adenopathy. Does not bruise/bleed easily.  Psychiatric/Behavioral:  Negative for dysphoric mood. The patient is not nervous/anxious.    Objective:  BP (!) 148/92 (BP Location: Right Arm, Cuff Size: Large)   Pulse  100   Temp (!) 97.4 F (36.3 C) (Temporal)   Ht 5' 5.5" (1.664 m)   Wt 209 lb 3 oz (94.9 kg)   LMP 04/18/2021   SpO2 99%   BMI 34.28 kg/m   Wt Readings from Last 3 Encounters:  05/13/21 209 lb 3 oz (94.9 kg)  06/16/20 215 lb (97.5 kg)  04/28/20 212 lb 1 oz (96.2 kg)      Physical Exam Vitals and nursing note reviewed.  Constitutional:      Appearance: Normal appearance. She is not ill-appearing.  HENT:     Head: Normocephalic and atraumatic.     Right Ear: Tympanic membrane, ear canal and external ear normal. There is no impacted cerumen.     Left Ear: Tympanic membrane, ear canal and external ear normal. There is no impacted cerumen.  Eyes:     General:        Right eye: No discharge.        Left eye: No discharge.     Extraocular Movements: Extraocular movements intact.     Conjunctiva/sclera: Conjunctivae normal.     Pupils: Pupils are equal, round, and reactive to light.  Neck:     Thyroid: No thyroid mass or thyromegaly.  Cardiovascular:     Rate and Rhythm: Normal rate and regular rhythm.     Pulses: Normal pulses.     Heart sounds: Normal heart sounds. No murmur heard. Pulmonary:     Effort: Pulmonary effort is normal. No respiratory distress.     Breath sounds: Normal breath sounds. No wheezing, rhonchi or rales.  Abdominal:     General: Bowel sounds are normal. There is no distension.     Palpations: Abdomen is soft. There is no mass.     Tenderness: There is no abdominal tenderness. There is no guarding or rebound.     Hernia: No hernia is present.  Genitourinary:    Comments: Pap attempted, discontinued given current menstrual bleeding Musculoskeletal:     Cervical back: Normal range of motion and neck supple. No rigidity.     Right lower leg: No edema.     Left lower leg: No edema.  Lymphadenopathy:     Cervical: No cervical adenopathy.  Skin:    General: Skin is warm and dry.     Findings: No rash.  Neurological:     General: No focal deficit  present.     Mental Status: She is alert. Mental status is at baseline.  Psychiatric:        Mood and Affect: Mood normal.        Behavior: Behavior normal.      Results for orders placed or performed in visit on 05/13/21  Vitamin B12  Result Value Ref Range   Vitamin B-12 589 211 - 911 pg/mL  IBC panel  Result Value Ref Range   Iron 52 42 - 145  ug/dL   Transferrin 333.0 212.0 - 360.0 mg/dL   Saturation Ratios 11.2 (L) 20.0 - 50.0 %   TIBC 466.2 (H) 250.0 - 450.0 mcg/dL  Ferritin  Result Value Ref Range   Ferritin 5.7 (L) 10.0 - 291.0 ng/mL  Folate  Result Value Ref Range   Folate 8.2 >5.9 ng/mL  VITAMIN D 25 Hydroxy (Vit-D Deficiency, Fractures)  Result Value Ref Range   VITD 22.85 (L) 30.00 - 100.00 ng/mL  Lipid panel  Result Value Ref Range   Cholesterol 202 (H) 0 - 200 mg/dL   Triglycerides 98.0 0.0 - 149.0 mg/dL   HDL 56.30 >39.00 mg/dL   VLDL 19.6 0.0 - 40.0 mg/dL   LDL Cholesterol 126 (H) 0 - 99 mg/dL   Total CHOL/HDL Ratio 4    NonHDL 145.74   Comprehensive metabolic panel  Result Value Ref Range   Sodium 138 135 - 145 mEq/L   Potassium 4.5 3.5 - 5.1 mEq/L   Chloride 103 96 - 112 mEq/L   CO2 31 19 - 32 mEq/L   Glucose, Bld 78 70 - 99 mg/dL   BUN 16 6 - 23 mg/dL   Creatinine, Ser 0.72 0.40 - 1.20 mg/dL   Total Bilirubin 0.6 0.2 - 1.2 mg/dL   Alkaline Phosphatase 71 39 - 117 U/L   AST 16 0 - 37 U/L   ALT 10 0 - 35 U/L   Total Protein 6.9 6.0 - 8.3 g/dL   Albumin 4.1 3.5 - 5.2 g/dL   GFR 96.93 >60.00 mL/min   Calcium 9.2 8.4 - 10.5 mg/dL  TSH  Result Value Ref Range   TSH 0.57 0.35 - 5.50 uIU/mL  CBC with Differential/Platelet  Result Value Ref Range   WBC 2.9 (L) 4.0 - 10.5 K/uL   RBC 4.20 3.87 - 5.11 Mil/uL   Hemoglobin 10.8 (L) 12.0 - 15.0 g/dL   HCT 34.4 (L) 36.0 - 46.0 %   MCV 81.8 78.0 - 100.0 fl   MCHC 31.5 30.0 - 36.0 g/dL   RDW 17.0 (H) 11.5 - 15.5 %   Platelets 318.0 150.0 - 400.0 K/uL   Neutrophils Relative % 56.0 43.0 - 77.0 %    Lymphocytes Relative 32.7 12.0 - 46.0 %   Monocytes Relative 9.4 3.0 - 12.0 %   Eosinophils Relative 1.3 0.0 - 5.0 %   Basophils Relative 0.6 0.0 - 3.0 %   Neutro Abs 1.6 1.4 - 7.7 K/uL   Lymphs Abs 0.9 0.7 - 4.0 K/uL   Monocytes Absolute 0.3 0.1 - 1.0 K/uL   Eosinophils Absolute 0.0 0.0 - 0.7 K/uL   Basophils Absolute 0.0 0.0 - 0.1 K/uL  Hemoglobin A1c  Result Value Ref Range   Hgb A1c MFr Bld 6.0 4.6 - 6.5 %   Depression screen [facility] 2/9 05/13/2021 04/28/2020 01/28/2019 01/28/2019 11/14/2017  Decreased Interest 0 0 0 0 1  Down, Depressed, Hopeless 0 0 0 0 2  PHQ - 2 Score 0 0 0 0 3  Altered sleeping - - 0 - 0  Tired, decreased energy - - 0 - 2  Change in appetite - - 0 - 2  Feeling bad or failure about yourself  - - 1 - 0  Trouble concentrating - - 0 - 3  Moving slowly or fidgety/restless - - 0 - 0  Suicidal thoughts - - 0 - 0  PHQ-9 Score - - 1 - 10    GAD 7 : Generalized Anxiety Score 01/28/2019 11/14/2017  Nervous, Anxious,  on Edge 0 3  Control/stop worrying 0 3  Worry too much - different things 0 3  Trouble relaxing 0 2  Restless 0 0  Easily annoyed or irritable 0 2  Afraid - awful might happen 0 0  Total GAD 7 Score 0 13   Assessment & Plan:  This visit occurred during the SARS-CoV-2 public health emergency.  Safety protocols were in place, including screening questions prior to the visit, additional usage of staff PPE, and extensive cleaning of exam room while observing appropriate contact time as indicated for disinfecting solutions.   Problem List Items Addressed This Visit     Health maintenance examination - Primary (Chronic)    Preventative protocols reviewed and updated unless pt declined. Discussed healthy diet and lifestyle.       Depression with anxiety    Stable period on effexor 129m bid.       Relevant Medications   venlafaxine (EFFEXOR) 100 MG tablet   HTN (hypertension)    Chronic, deterioration noted off medications. She will start monitoring blood  pressures at home with her home cuff, I asked her to send me readings in a few wks and in interim work on low sodium diet, increased aerobic exercise.       Hypothyroid    She has not recently been taking levothyroxine. Will update TSH today.       Relevant Medications   levothyroxine (SYNTHROID) 88 MCG tablet   Other Relevant Orders   TSH (Completed)   Hyperglycemia    Update A1c.       Relevant Orders   Hemoglobin A1c (Completed)   Obesity, Class I, BMI 30-34.9    Encouraged healthy diet and lifestyle for sustainable weight loss.       Bariatric surgery status    Update labs today. She continues vitamin D, iron, MVI, thiamine.       Relevant Orders   Vitamin B12 (Completed)   Vitamin B1   IBC panel (Completed)   Ferritin (Completed)   Folate (Completed)   VITAMIN D 25 Hydroxy (Vit-D Deficiency, Fractures) (Completed)   Lipid panel (Completed)   Comprehensive metabolic panel (Completed)   TSH (Completed)   CBC with Differential/Platelet (Completed)   Hemoglobin A1c (Completed)   IDA (iron deficiency anemia)    Update labs on daily oral iron.  Last iron infusion was 05/2020 ([facility])      Vitamin B1 deficiency    Update b1 on daily 547mreplacement       Vitamin D deficiency    Update vit D on 50k weekly replacement.       Abnormal Pap smear of cervix    Pap smear deferred as currently on her period.  ASCUS with + HRHPV 04/2019 (reassuring colposcopy)  Pap WNL 04/2020      OSA (obstructive sleep apnea)    Not currently using CPAP.       Other Visit Diagnoses     Need for influenza vaccination       Relevant Orders   Flu Vaccine QUAD [id number] (Fluarix, Fluzone & Alfiuria Quad PF) (Completed)   Special screening for malignant neoplasms, colon       Relevant Orders   Fecal occult blood, imunochemical        Meds ordered this encounter  Medications   levothyroxine (SYNTHROID) 88 MCG tablet    Sig: Take 1 tablet (88 mcg total) by mouth daily.     Dispense:  90 tablet    Refill:  3  venlafaxine (EFFEXOR) 100 MG tablet    Sig: Take 1 tablet (100 mg total) by mouth 2 (two) times daily.    Dispense:  180 tablet    Refill:  3   Cholecalciferol (VITAMIN D3) 1.25 MG (50000 UT) TABS    Sig: Take 1 tablet by mouth once a week.    Dispense:  12 tablet    Refill:  3   Orders Placed This Encounter  Procedures   Fecal occult blood, imunochemical    Standing Status:   Future    Standing Expiration Date:   05/13/2022   Flu Vaccine QUAD 68moIM (Fluarix, Fluzone & Alfiuria Quad PF)   Vitamin B12   Vitamin B1   IBC panel   Ferritin   Folate   VITAMIN D 25 Hydroxy (Vit-D Deficiency, Fractures)   Lipid panel   Comprehensive metabolic panel   TSH   CBC with Differential/Platelet   Hemoglobin A1c     Patient instructions: Flu shot today Check with pharmacy about getting a second shingles shot (last one was 04/2020). Labs today  Pass by lab to pick up stool kit.  Call to schedule free mammogram at [phone] ask about free stool kit through them as well.  Blood pressures are staying elevated - start checking at home, goal is <140/90. Send me some readings in 2-3 weeks. If persistently >140/90, we will start blood pressure medicine. Work on limiting salt/sodium in the diet, increase aerobic exercise slowly, increase water intake.  Medicines refilled today.  Return in 6 months for follow up visit.    Follow up plan: Return in about 1 year (around 05/13/2022) for annual exam, prior fasting for blood work.  JRia Bush MD

## 2021-05-13 NOTE — Patient Instructions (Addendum)
Flu shot today Check with pharmacy about getting a second shingles shot (last one was 04/2020). Labs today  Pass by lab to pick up stool kit.  Call to schedule free mammogram at [phone], ask about free stool kit through them as well.  Blood pressures are staying elevated - start checking at home, goal is <140/90. Send me some readings in 2-3 weeks. If persistently >140/90, we will start blood pressure medicine. Work on limiting salt/sodium in the diet, increase aerobic exercise slowly, increase water intake.  Medicines refilled today.  Return in 6 months for follow up visit.    Health Maintenance, Female Adopting a healthy lifestyle and getting preventive care are important in promoting health and wellness. Ask your health care provider about: The right schedule for you to have regular tests and exams. Things you can do on your own to prevent diseases and keep yourself healthy. What should I know about diet, weight, and exercise? Eat a healthy diet  Eat a diet that includes plenty of vegetables, fruits, low-fat dairy products, and lean protein. Do not eat a lot of foods that are high in solid fats, added sugars, or sodium. Maintain a healthy weight Body mass index (BMI) is used to identify weight problems. It estimates body fat based on height and weight. Your health care provider can help determine your BMI and help you achieve or maintain a healthy weight. Get regular exercise Get regular exercise. This is one of the most important things you can do for your health. Most adults should: Exercise for at least 150 minutes each week. The exercise should increase your heart rate and make you sweat (moderate-intensity exercise). Do strengthening exercises at least twice a week. This is in addition to the moderate-intensity exercise. Spend less time sitting. Even light physical activity can be beneficial. Watch cholesterol and blood lipids Have your blood tested for lipids and cholesterol  at 51 years of age, then have this test every 5 years. Have your cholesterol levels checked more often if: Your lipid or cholesterol levels are high. You are older than 51 years of age. You are at high risk for heart disease. What should I know about cancer screening? Depending on your health history and family history, you may need to have cancer screening at various ages. This may include screening for: Breast cancer. Cervical cancer. Colorectal cancer. Skin cancer. Lung cancer. What should I know about heart disease, diabetes, and high blood pressure? Blood pressure and heart disease High blood pressure causes heart disease and increases the risk of stroke. This is more likely to develop in people who have high blood pressure readings, are of African descent, or are overweight. Have your blood pressure checked: Every 3-5 years if you are 37-67 years of age. Every year if you are 20 years old or older. Diabetes Have regular diabetes screenings. This checks your fasting blood sugar level. Have the screening done: Once every three years after age 29 if you are at a normal weight and have a low risk for diabetes. More often and at a younger age if you are overweight or have a high risk for diabetes. What should I know about preventing infection? Hepatitis B If you have a higher risk for hepatitis B, you should be screened for this virus. Talk with your health care provider to find out if you are at risk for hepatitis B infection. Hepatitis C Testing is recommended for: Everyone born from [birth year] through 1966. Anyone with known risk factors for  hepatitis C. Sexually transmitted infections (STIs) Get screened for STIs, including gonorrhea and chlamydia, if: You are sexually active and are younger than 51 years of age. You are older than 51 years of age and your health care provider tells you that you are at risk for this type of infection. Your sexual activity has changed since you were  last screened, and you are at increased risk for chlamydia or gonorrhea. Ask your health care provider if you are at risk. Ask your health care provider about whether you are at high risk for HIV. Your health care provider may recommend a prescription medicine to help prevent HIV infection. If you choose to take medicine to prevent HIV, you should first get tested for HIV. You should then be tested every 3 months for as long as you are taking the medicine. Pregnancy If you are about to stop having your period (premenopausal) and you may become pregnant, seek counseling before you get pregnant. Take 400 to 800 micrograms (mcg) of folic acid every day if you become pregnant. Ask for birth control (contraception) if you want to prevent pregnancy. Osteoporosis and menopause Osteoporosis is a disease in which the bones lose minerals and strength with aging. This can result in bone fractures. If you are 68 years old or older, or if you are at risk for osteoporosis and fractures, ask your health care provider if you should: Be screened for bone loss. Take a calcium or vitamin D supplement to lower your risk of fractures. Be given hormone replacement therapy (HRT) to treat symptoms of menopause. Follow these instructions at home: Lifestyle Do not use any products that contain nicotine or tobacco, such as cigarettes, e-cigarettes, and chewing tobacco. If you need help quitting, ask your health care provider. Do not use street drugs. Do not share needles. Ask your health care provider for help if you need support or information about quitting drugs. Alcohol use Do not drink alcohol if: Your health care provider tells you not to drink. You are pregnant, may be pregnant, or are planning to become pregnant. If you drink alcohol: Limit how much you use to 0-1 drink a day. Limit intake if you are breastfeeding. Be aware of how much alcohol is in your drink. In the [place] one drink equals one 12 oz bottle of  beer (355 mL), one 5 oz glass of wine (148 mL), or one 1 oz glass of hard liquor (44 mL). General instructions Schedule regular health, dental, and eye exams. Stay current with your vaccines. Tell your health care provider if: You often feel depressed. You have ever been abused or do not feel safe at home. Summary Adopting a healthy lifestyle and getting preventive care are important in promoting health and wellness. Follow your health care provider's instructions about healthy diet, exercising, and getting tested or screened for diseases. Follow your health care provider's instructions on monitoring your cholesterol and blood pressure. This information is not intended to replace advice given to you by your health care provider. Make sure you discuss any questions you have with your health care provider. Document Revised: 10/01/2020 Document Reviewed: 07/17/2018 Elsevier Patient Education  2022 [employer].

## 2021-05-13 NOTE — Assessment & Plan Note (Signed)
Preventative protocols reviewed and updated unless pt declined. Discussed healthy diet and lifestyle.  

## 2021-05-14 NOTE — Assessment & Plan Note (Addendum)
Pap smear deferred as currently on her period.  ASCUS with + HRHPV 04/2019 (reassuring colposcopy)  Pap WNL 04/2020

## 2021-05-14 NOTE — Assessment & Plan Note (Signed)
Stable period on effexor 100mg  bid.

## 2021-05-14 NOTE — Assessment & Plan Note (Signed)
Chronic, deterioration noted off medications. She will start monitoring blood pressures at home with her home cuff, I asked her to send me readings in a few wks and in interim work on low sodium diet, increased aerobic exercise.

## 2021-05-14 NOTE — Assessment & Plan Note (Signed)
Update A1c ?

## 2021-05-14 NOTE — Assessment & Plan Note (Addendum)
Encouraged healthy diet and lifestyle for sustainable weight loss.  

## 2021-05-14 NOTE — Assessment & Plan Note (Signed)
Update vit D on 50k weekly replacement.

## 2021-05-14 NOTE — Assessment & Plan Note (Signed)
Update b1 on daily 50mg  replacement

## 2021-05-14 NOTE — Assessment & Plan Note (Signed)
Not currently using CPAP.

## 2021-05-14 NOTE — Assessment & Plan Note (Signed)
Update labs on daily oral iron.  Last iron infusion was 05/2020 [facility])

## 2021-05-14 NOTE — Assessment & Plan Note (Signed)
She has not recently been taking levothyroxine. Will update TSH today.

## 2021-05-14 NOTE — Assessment & Plan Note (Signed)
Update labs today. She continues vitamin D, iron, MVI, thiamine.

## 2021-05-17 LAB — VITAMIN B1: Vitamin B1 (Thiamine): <6 nmol/L — ABNORMAL LOW (ref 8–30)

## 2021-07-27 ENCOUNTER — Other Ambulatory Visit: Payer: Self-pay | Admitting: Family Medicine

## 2021-11-16 ENCOUNTER — Ambulatory Visit: Payer: 59 | Admitting: Family Medicine

## 2022-06-25 ENCOUNTER — Other Ambulatory Visit: Payer: Self-pay | Admitting: Family Medicine
